# Patient Record
Sex: Female | Born: 1955 | Race: White | Hispanic: No | Marital: Married | Smoking: Never smoker
Health system: Western US, Academic
[De-identification: ages and names within clinical notes are randomized; demographics above are authoritative.]

## PROBLEM LIST (undated history)

## (undated) DIAGNOSIS — J329 Chronic sinusitis, unspecified: Secondary | ICD-10-CM

## (undated) DIAGNOSIS — F419 Anxiety disorder, unspecified: Principal | ICD-10-CM

## (undated) DIAGNOSIS — R0989 Other specified symptoms and signs involving the circulatory and respiratory systems: Secondary | ICD-10-CM

## (undated) DIAGNOSIS — I10 Essential (primary) hypertension: Secondary | ICD-10-CM

## (undated) DIAGNOSIS — J309 Allergic rhinitis, unspecified: Secondary | ICD-10-CM

## (undated) DIAGNOSIS — F329 Major depressive disorder, single episode, unspecified: Secondary | ICD-10-CM

## (undated) HISTORY — PX: TONSILLECTOMY: SUR1361

## (undated) HISTORY — PX: SINUSOTOMY: SHX291

## (undated) HISTORY — PX: JOINT REPLACEMENT: SHX530

## (undated) HISTORY — DX: Major depressive disorder, single episode, unspecified: F32.9

## (undated) HISTORY — DX: Anxiety disorder, unspecified: F41.9

## (undated) HISTORY — PX: ANTERIOR CRUCIATE LIGAMENT REPAIR: SHX115

## (undated) HISTORY — PX: TONSILLECTOMY AND ADENOIDECTOMY: SHX2683

## (undated) HISTORY — DX: Chronic sinusitis, unspecified: J32.9

## (undated) HISTORY — DX: Other specified symptoms and signs involving the circulatory and respiratory systems: R09.89

## (undated) HISTORY — PX: CARPAL TUNNEL RELEASE: SHX101

## (undated) HISTORY — DX: Essential (primary) hypertension: I10

## (undated) HISTORY — PX: ADENOIDECTOMY: SHX1128B

## (undated) MED ORDER — ALPRAZOLAM 0.25 MG OR TABS
0.25 mg | ORAL_TABLET | Freq: Every evening | ORAL | 0 refills | Status: AC | PRN
Start: 2018-07-30 — End: ?

## (undated) MED ORDER — LISINOPRIL 5 MG OR TABS
ORAL_TABLET | ORAL | 2 refills | Status: AC
Start: 2023-01-07 — End: ?

---

## 2014-10-07 DIAGNOSIS — M778 Other enthesopathies, not elsewhere classified: Secondary | ICD-10-CM

## 2015-04-10 DIAGNOSIS — F4320 Adjustment disorder, unspecified: Secondary | ICD-10-CM

## 2016-09-27 ENCOUNTER — Encounter (INDEPENDENT_AMBULATORY_CARE_PROVIDER_SITE_OTHER): Payer: Self-pay | Admitting: Family Medicine

## 2016-10-21 ENCOUNTER — Encounter (INDEPENDENT_AMBULATORY_CARE_PROVIDER_SITE_OTHER): Payer: Self-pay | Admitting: Physician Assistant

## 2016-10-21 ENCOUNTER — Ambulatory Visit (INDEPENDENT_AMBULATORY_CARE_PROVIDER_SITE_OTHER): Admitting: Physician Assistant

## 2016-10-21 VITALS — BP 120/78 | HR 78 | Temp 98.7°F | Resp 16 | Ht 63.5 in | Wt 162.0 lb

## 2016-10-21 DIAGNOSIS — Z1231 Encounter for screening mammogram for malignant neoplasm of breast: Secondary | ICD-10-CM

## 2016-10-21 DIAGNOSIS — F419 Anxiety disorder, unspecified: Principal | ICD-10-CM

## 2016-10-21 DIAGNOSIS — Z1239 Encounter for other screening for malignant neoplasm of breast: Secondary | ICD-10-CM

## 2016-10-21 DIAGNOSIS — R2242 Localized swelling, mass and lump, left lower limb: Secondary | ICD-10-CM

## 2016-10-21 DIAGNOSIS — B029 Zoster without complications: Secondary | ICD-10-CM

## 2016-10-21 MED ORDER — ALPRAZOLAM 0.25 MG OR TABS
0.2500 mg | ORAL_TABLET | Freq: Every evening | ORAL | 0 refills | Status: DC | PRN
Start: 2016-10-21 — End: 2019-01-25

## 2016-10-21 MED ORDER — VALACYCLOVIR HCL 1000 MG OR TABS
1000.0000 mg | ORAL_TABLET | Freq: Three times a day (TID) | ORAL | 0 refills | Status: DC
Start: 2016-10-21 — End: 2016-12-20

## 2016-10-21 MED ORDER — ALPRAZOLAM 0.25 MG OR TABS
0.25 mg | ORAL_TABLET | Freq: Every evening | ORAL | Status: DC | PRN
Start: ? — End: 2016-10-21

## 2016-10-21 MED ORDER — FLUTICASONE PROPIONATE 50 MCG/ACT NA SUSP
1.00 | Freq: Every day | NASAL | Status: DC
Start: ? — End: 2017-08-30

## 2016-10-24 ENCOUNTER — Telehealth (INDEPENDENT_AMBULATORY_CARE_PROVIDER_SITE_OTHER): Payer: Self-pay | Admitting: Physician Assistant

## 2016-10-24 NOTE — Telephone Encounter (Signed)
Podiatrist referral submitted to [facility].

## 2016-10-25 ENCOUNTER — Encounter (INDEPENDENT_AMBULATORY_CARE_PROVIDER_SITE_OTHER): Payer: Self-pay | Admitting: Physician Assistant

## 2016-11-24 ENCOUNTER — Encounter (INDEPENDENT_AMBULATORY_CARE_PROVIDER_SITE_OTHER): Payer: Self-pay | Admitting: Family Medicine

## 2016-11-24 HISTORY — PX: COLONOSCOPY: SHX174

## 2016-12-12 ENCOUNTER — Encounter (INDEPENDENT_AMBULATORY_CARE_PROVIDER_SITE_OTHER): Payer: Self-pay | Admitting: Family Medicine

## 2016-12-19 NOTE — Telephone Encounter (Signed)
Pt called back to check if we received her pathology report, pt was notified unfortunately not yet but I'll call [facility] to retreive the pathology report.    Contacted [facility] no answer left a detail message on the nurse dept line requesting for pathology report to be faxed to [phone].

## 2016-12-20 ENCOUNTER — Encounter (INDEPENDENT_AMBULATORY_CARE_PROVIDER_SITE_OTHER): Payer: Self-pay | Admitting: Physician Assistant

## 2016-12-20 ENCOUNTER — Ambulatory Visit (INDEPENDENT_AMBULATORY_CARE_PROVIDER_SITE_OTHER): Admitting: Physician Assistant

## 2016-12-20 VITALS — BP 142/98 | HR 72 | Temp 98.9°F | Resp 16 | Ht 63.5 in | Wt 161.8 lb

## 2016-12-20 DIAGNOSIS — J3489 Other specified disorders of nose and nasal sinuses: Principal | ICD-10-CM

## 2016-12-20 MED ORDER — FEXOFENADINE HCL 180 MG OR TABS
180.0000 mg | ORAL_TABLET | Freq: Every day | ORAL | 2 refills | Status: DC
Start: 2016-12-20 — End: 2017-01-13

## 2016-12-20 MED ORDER — METHYLPREDNISOLONE 4 MG OR KIT
ORAL_TABLET | ORAL | 0 refills | Status: DC
Start: 2016-12-20 — End: 2017-01-13

## 2017-01-03 ENCOUNTER — Encounter (INDEPENDENT_AMBULATORY_CARE_PROVIDER_SITE_OTHER): Payer: Self-pay

## 2017-01-03 ENCOUNTER — Encounter (INDEPENDENT_AMBULATORY_CARE_PROVIDER_SITE_OTHER): Payer: Self-pay | Admitting: Physician Assistant

## 2017-01-03 NOTE — Telephone Encounter (Signed)
From: Debbie Hudson  To: Debbie Hudson, [name]  Sent: 01/03/2017 2:27 PM PST  Subject: 20-Other    Hi,  I wanted to let you know that I am still having some sinus pressure and a bit of upper respiratory issues since my last visit and I did finish the steroid pack. I had maybe 2 days after it that I thought I was getting better. Sunday I woke up with a little bit of laryngitis and today the sinus pressure in my heBad is bothersome and I am extremely fatigued! You said to just call and maybe you would order a antibiotic or ? I tried calling the office this afternoon with no answer , so I am sending this message.  Debbie, Hudson  [phone]  [email or web address]

## 2017-01-13 ENCOUNTER — Telehealth (INDEPENDENT_AMBULATORY_CARE_PROVIDER_SITE_OTHER): Payer: Self-pay | Admitting: Physician Assistant

## 2017-01-13 ENCOUNTER — Other Ambulatory Visit (INDEPENDENT_AMBULATORY_CARE_PROVIDER_SITE_OTHER): Payer: Self-pay | Admitting: Physician Assistant

## 2017-01-13 ENCOUNTER — Ambulatory Visit (INDEPENDENT_AMBULATORY_CARE_PROVIDER_SITE_OTHER): Admitting: Physician Assistant

## 2017-01-13 ENCOUNTER — Encounter (INDEPENDENT_AMBULATORY_CARE_PROVIDER_SITE_OTHER): Payer: Self-pay | Admitting: Physician Assistant

## 2017-01-13 VITALS — BP 120/84 | HR 66 | Temp 98.3°F | Resp 14 | Ht 63.5 in | Wt 160.0 lb

## 2017-01-13 DIAGNOSIS — R0989 Other specified symptoms and signs involving the circulatory and respiratory systems: Secondary | ICD-10-CM

## 2017-01-13 DIAGNOSIS — J329 Chronic sinusitis, unspecified: Principal | ICD-10-CM

## 2017-01-13 MED ORDER — AZITHROMYCIN 250 MG OR TABS
250.0000 mg | ORAL_TABLET | ORAL | 0 refills | Status: DC
Start: 2017-01-13 — End: 2017-08-30

## 2017-01-13 MED ORDER — ALBUTEROL SULFATE 108 (90 BASE) MCG/ACT IN AERS
2.0000 | INHALATION_SPRAY | Freq: Four times a day (QID) | RESPIRATORY_TRACT | 0 refills | Status: DC | PRN
Start: 2017-01-13 — End: 2017-01-13

## 2017-01-13 MED ORDER — ALBUTEROL SULFATE 108 (90 BASE) MCG/ACT IN AERS
2.0000 | INHALATION_SPRAY | Freq: Four times a day (QID) | RESPIRATORY_TRACT | 0 refills | Status: DC | PRN
Start: 2017-01-13 — End: 2017-08-30

## 2017-01-13 NOTE — Telephone Encounter (Signed)
[facility] referral ENT submitted  Ref: [id number]

## 2017-01-13 NOTE — Assessment & Plan Note (Addendum)
Stable  Start albuterol as prescribed   Monitor symptoms  F/u if worsening

## 2017-01-13 NOTE — Progress Notes (Signed)
Encounter Date:  01/13/17  11:29 AM   PCP: Adrienne Mocha  MRN: [id number]        HPI:  Debbie Hudson is a 61 year old female presents c/o continued sinus issues since last visit x almost 1 month.    Pt admits to chronic sinusitis that is difficult to get rid of    Pt was on abx x 2 in the past and had to referred to ENT  Pt was placed on steroid pack, flonase, antihistamine with no relief. Pt states she might have felt relief for 2 days on the medrol pack, however symptoms returned.    Pt admits to being on allergy shot therapy and can be the likely reason this sinus issue is not going away.          PROBLEM  LIST:  Patient Active Problem List   Diagnosis   . Anxiety   . Herpes zoster without complication   . Mass of left foot   . Sinus pressure   . Recurrent sinusitis   . Chest congestion         PAST MEDICAL HISTORY:  Past Medical History:   Diagnosis Date   . Anxiety    . Major depressive disorder, single episode        PAST SURGICAL HISTORY:  Past Surgical History:   Procedure Laterality Date   . ANTERIOR CRUCIATE LIGAMENT REPAIR Left    . CARPAL TUNNEL RELEASE Bilateral    . CESAREAN SECTION, CLASSIC  1984, 1986   . TONSILLECTOMY AND ADENOIDECTOMY          FAMILY HISTORY:   Family History   Problem Relation Name Age of Onset   . Cancer Mother          Lymphoma   . Cancer Father          Colon cancer, tumer to neck carcoma         SOCIAL HISTORY:  Social History     Socioeconomic History   . Marital status: Married     Spouse name: Not on file   . Number of children: Not on file   . Years of education: Not on file   . Highest education level: Not on file   Social Needs   . Financial resource strain: Not on file   . Food insecurity - worry: Not on file   . Food insecurity - inability: Not on file   . Transportation needs - medical: Not on file   . Transportation needs - non-medical: Not on file   Occupational History   . Not on file   Tobacco Use   . Smoking status: Never Smoker   . Smokeless tobacco: Never Used      Substance and Sexual Activity   . Alcohol use: Yes     Comment: occ   . Drug use: No   . Sexual activity: Not on file   Other Topics Concern   . Not on file   Social History Narrative   . Not on file     Social History     Tobacco Use   Smoking Status Never Smoker   Smokeless Tobacco Never Used      Social History     Substance and Sexual Activity   Alcohol Use Yes    Comment: occ      Social History     Substance and Sexual Activity   Drug Use No  There is no immunization history on file for this patient.      CURRENT  MEDICATIONS:  Current Outpatient Medications on File Prior to Visit   Medication Sig Dispense Refill   . ALPRAZolam (XANAX) 0.25 MG tablet Take 1 tablet (0.25 mg) by mouth nightly as needed for Anxiety. 30 tablet 0   . [DISCONTINUED] fexofenadine (ALLEGRA) 180 MG tablet Take 1 tablet (180 mg) by mouth daily. 30 tablet 2   . fluticasone propionate (FLONASE) 50 MCG/ACT nasal spray Spray 1 bottle into each nostril daily.     . [DISCONTINUED] methylPREDNISolone (MEDROL DOSEPACK) 4 MG tablet Take as directed on package 1 Package 0     No current facility-administered medications on file prior to visit.      Outpatient Medications Marked as Taking for the 01/13/17 encounter (Office Visit) with Danton Sewer, PA   Medication Sig Dispense Refill   . ALPRAZolam (XANAX) 0.25 MG tablet Take 1 tablet (0.25 mg) by mouth nightly as needed for Anxiety. 30 tablet 0   . fluticasone propionate (FLONASE) 50 MCG/ACT nasal spray Spray 1 bottle into each nostril daily.          ALLERGIES:    Allergies   Allergen Reactions   . Penicillin G Rash   . Dilaudid  [Hydromorphone Hcl] Itching   . Hydrocodone Itching   . Venlafaxine Unspecified     Felt weird          REVIEW OF SYSTEMS:  Review of Systems  ROS negative except for HPI      PHYSICAL EXAM:   01/13/17  1123   BP: 120/84   Pulse: 66   Resp: 14   Temp: 98.3 F (36.8 C)     Body mass index is 27.9 kg/m.    Physical Exam   Constitutional: She is oriented to  person, place, and time. She appears well-developed and well-nourished.   HENT:   Head: Normocephalic and atraumatic.   Right Ear: Tympanic membrane is not injected, not scarred, not perforated and not erythematous. A middle ear effusion is present.   Left Ear: Tympanic membrane is not injected, not scarred, not perforated and not erythematous. A middle ear effusion is present.   Nose: Right sinus exhibits frontal sinus tenderness. Left sinus exhibits frontal sinus tenderness.   Cardiovascular: Normal rate.   Pulmonary/Chest: Effort normal and breath sounds normal. No respiratory distress. She has no wheezes.   Neurological: She is alert and oriented to person, place, and time.   Skin: Skin is warm and dry.   Psychiatric: She has a normal mood and affect. Her behavior is normal. Thought content normal.   Vitals reviewed.          ASSESSMENT & PLAN:    Debbie Hudson was seen today for sinus problem.    Diagnoses and all orders for this visit:    Recurrent sinusitis  Assessment & Plan:  Active  Start z-pack  Failed antihistamine  History of failing 2 courses of abx  Referral to ENT    Orders:  -     azithromycin (ZITHROMAX) 250 MG tablet; Take 1 tablet (250 mg) by mouth As Directed. Take 2 tablets by mouth on day 1, then take 1 tablet daily on days 2-5.  -     ENT Clinic    Chest congestion  Assessment & Plan:  Stable  Start albuterol as prescribed   Monitor symptoms  F/u if worsening      Orders:  -  albuterol 108 (90 Base) MCG/ACT inhaler; Inhale 2 puffs by mouth every 6 hours as needed for Wheezing.        ICD-10-CM ICD-9-CM    1. Recurrent sinusitis J32.9 473.9 azithromycin (ZITHROMAX) 250 MG tablet      ENT Clinic   2. Chest congestion R09.89 786.9 albuterol 108 (90 Base) MCG/ACT inhaler             No future appointments.        Electronically reviewed and signed by Danton Sewer, PA-C    [facility]  [email or web address]

## 2017-01-13 NOTE — Assessment & Plan Note (Signed)
Active  Start z-pack  Failed antihistamine  History of failing 2 courses of abx  Referral to ENT

## 2017-01-30 ENCOUNTER — Encounter (INDEPENDENT_AMBULATORY_CARE_PROVIDER_SITE_OTHER): Payer: Self-pay | Admitting: Family Medicine

## 2017-03-01 ENCOUNTER — Other Ambulatory Visit: Payer: Self-pay

## 2017-03-13 ENCOUNTER — Encounter (INDEPENDENT_AMBULATORY_CARE_PROVIDER_SITE_OTHER): Payer: Self-pay | Admitting: Family Medicine

## 2017-04-17 HISTORY — PX: SINUS SURGERY: SHX187

## 2017-07-25 ENCOUNTER — Encounter (INDEPENDENT_AMBULATORY_CARE_PROVIDER_SITE_OTHER): Payer: Self-pay

## 2017-08-01 ENCOUNTER — Ambulatory Visit (HOSPITAL_COMMUNITY)
Admission: EM | Admit: 2017-08-01 | Discharge: 2017-08-01 | Disposition: A | Discharge status: Home / Self Care | Attending: Family Medicine | Admitting: Family Medicine

## 2017-08-01 ENCOUNTER — Other Ambulatory Visit: Payer: Self-pay

## 2017-08-01 ENCOUNTER — Ambulatory Visit (INDEPENDENT_AMBULATORY_CARE_PROVIDER_SITE_OTHER)

## 2017-08-01 ENCOUNTER — Encounter (HOSPITAL_COMMUNITY): Payer: Self-pay | Admitting: Emergency Medicine

## 2017-08-01 DIAGNOSIS — S9031XA Contusion of right foot, initial encounter: Secondary | ICD-10-CM

## 2017-08-01 DIAGNOSIS — M79671 Pain in right foot: Secondary | ICD-10-CM | POA: Diagnosis not present
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
## 2017-08-27 ENCOUNTER — Other Ambulatory Visit: Payer: Self-pay

## 2017-08-30 ENCOUNTER — Ambulatory Visit (INDEPENDENT_AMBULATORY_CARE_PROVIDER_SITE_OTHER): Admitting: Family Medicine

## 2017-08-30 ENCOUNTER — Encounter (INDEPENDENT_AMBULATORY_CARE_PROVIDER_SITE_OTHER): Payer: Self-pay | Admitting: Family Medicine

## 2017-08-30 VITALS — BP 122/82 | HR 103 | Temp 99.2°F | Resp 14 | Ht 63.5 in | Wt 168.8 lb

## 2017-09-05 ENCOUNTER — Encounter (INDEPENDENT_AMBULATORY_CARE_PROVIDER_SITE_OTHER): Payer: Self-pay

## 2017-09-07 LAB — COMPREHENSIVE METABOLIC PANEL, BLOOD
ALT (SGPT): 20 U/L (ref 6–29)
AST (SGOT): 21 U/L (ref 10–35)
Albumin/Glob Ratio: 1.7 (calc) (ref 1.0–2.5)
Albumin: 4.5 g/dL (ref 3.6–5.1)
Alkaline Phos: 91 U/L (ref 33–130)
BUN: 15 mg/dL (ref 7–25)
Bilirubin, Total: 0.7 mg/dL (ref 0.2–1.2)
Calcium: 9.9 mg/dL (ref 8.6–10.4)
Carbon Dioxide: 27 mmol/L (ref 20–32)
Chloride: 104 mmol/L (ref 98–110)
Creatinine: 0.89 mg/dL (ref 0.50–0.99)
Globulin: 2.6 g/dL (calc) (ref 1.9–3.7)
Glucose: 89 mg/dL (ref 65–99)
Potassium: 3.9 mmol/L (ref 3.5–5.3)
Sodium: 141 mmol/L (ref 135–146)
Total Protein: 7.1 g/dL (ref 6.1–8.1)
eGFR African American: 81 mL/min/{1.73_m2} (ref >=60)
eGFR non-Afr.American: 69 mL/min/{1.73_m2} (ref >=60)

## 2017-09-07 LAB — LIPID PANEL W/REFLEX TO DIRECT LDL
Chol/HDLC Ratio: 2.9 (calc) (ref <5.0)
Cholesterol: 212 mg/dL — ABNORMAL HIGH (ref <200)
HDL Cholesterol: 74 mg/dL (ref >50)
LDL-Cholesterol: 115 mg/dL (calc) — ABNORMAL HIGH
Non-HDL Cholesterol: 138 mg/dL (calc) — ABNORMAL HIGH (ref <130)
Triglycerides: 118 mg/dL (ref <150)

## 2017-09-07 LAB — CBC WITH DIFF, BLOOD
Abs Basophils: 49 cells/uL (ref 0–200)
Abs Eosinophils: 130 cells/uL (ref 15–500)
Abs Lymphs: 2576 cells/uL (ref 850–3900)
Abs Monocytes: 340 cells/uL (ref 200–950)
Abs NRBC: 0 cells/uL
Abs Neutrophils: 2306 cells/uL (ref 1500–7800)
Basophils: 0.9 %
Eosinophils: 2.4 %
HCT: 44.1 % (ref 35.0–45.0)
HGB: 15.1 g/dL (ref 11.7–15.5)
Lymps: 47.7 %
MCH: 28.9 pg (ref 27.0–33.0)
MCHC: 34.2 g/dL (ref 32.0–36.0)
MCV: 84.5 fL (ref 80.0–100.0)
MPV: 10.7 fL (ref 7.5–12.5)
Monocytes: 6.3 %
PLT: 242 10*3/uL (ref 140–400)
RBC: 5.22 10*6/uL — ABNORMAL HIGH (ref 3.80–5.10)
RDW: 13.3 % (ref 11.0–15.0)
SEGS: 42.7 %
WBC: 5.4 10*3/uL (ref 3.8–10.8)

## 2017-09-07 LAB — FOOD SPECIFIC IGG ALLERGY (ADULT) PANEL - QUEST HISTORICAL
Cacao (Chocolate) (F93) IgG: 2.3 ug/mL — ABNORMAL HIGH (ref <2.0)
Casein (F78) IgG: 3.4 ug/mL — ABNORMAL HIGH (ref <2.0)
Codfish (F3) IgG: <2 ug/mL (ref <2.0)
Coffee (F221) IgG: 3 ug/mL — ABNORMAL HIGH (ref <2.0)
Egg White (F1) IgG: 3.2 ug/mL — ABNORMAL HIGH (ref <2.0)
Maize/Corn (F8) IgG: 2.2 ug/mL — ABNORMAL HIGH (ref <2.0)
Peanut (F13) IgG: <2 ug/mL (ref <2.0)
Soybean (F14) IgG: <2 ug/mL (ref <2.0)
Tomato (F25) IgG: 2.5 ug/mL — ABNORMAL HIGH (ref <2.0)
Wheat (F4) IgG: 5.1 ug/mL — ABNORMAL HIGH (ref <2.0)
Yeast (Bakers/Brewers) (F45) IgG: 3.5 ug/mL — ABNORMAL HIGH (ref <2.0)

## 2017-09-07 LAB — TSH, BLOOD: TSH: 1.87 mIU/L (ref 0.40–4.50)

## 2017-09-07 LAB — FREE THYROXINE, BLOOD: Free T4: 1.2 ng/dL (ref 0.8–1.8)

## 2017-09-07 LAB — SED RATE, BLOOD: Sed Rate: 2 mm/h (ref <=30)

## 2017-09-07 LAB — VITAMIN D, 25-OH TOTAL: Vitamin D, 25-OH, Total: 45 ng/mL (ref 30–100)

## 2018-02-13 ENCOUNTER — Encounter (INDEPENDENT_AMBULATORY_CARE_PROVIDER_SITE_OTHER): Payer: Self-pay | Admitting: Family Medicine

## 2018-02-14 ENCOUNTER — Encounter (INDEPENDENT_AMBULATORY_CARE_PROVIDER_SITE_OTHER): Payer: Self-pay | Admitting: Family Medicine

## 2018-04-17 ENCOUNTER — Encounter (INDEPENDENT_AMBULATORY_CARE_PROVIDER_SITE_OTHER): Payer: Self-pay | Admitting: Medical

## 2018-04-17 ENCOUNTER — Ambulatory Visit (INDEPENDENT_AMBULATORY_CARE_PROVIDER_SITE_OTHER): Admitting: Medical

## 2018-04-17 MED ORDER — CEPHALEXIN 500 MG OR CAPS
500.0000 mg | ORAL_CAPSULE | Freq: Two times a day (BID) | ORAL | 0 refills | Status: DC
Start: 2018-04-17 — End: 2019-01-25

## 2018-04-17 MED ORDER — FLUTICASONE PROPIONATE 50 MCG/ACT NA SUSP: 1.00 | Freq: Every day | NASAL | Status: AC

## 2018-07-30 ENCOUNTER — Other Ambulatory Visit (INDEPENDENT_AMBULATORY_CARE_PROVIDER_SITE_OTHER): Payer: Self-pay | Admitting: Physician Assistant

## 2018-11-18 ENCOUNTER — Encounter (INDEPENDENT_AMBULATORY_CARE_PROVIDER_SITE_OTHER): Payer: Self-pay | Admitting: Family Medicine

## 2018-11-29 ENCOUNTER — Encounter (INDEPENDENT_AMBULATORY_CARE_PROVIDER_SITE_OTHER): Payer: Self-pay

## 2018-11-30 ENCOUNTER — Encounter (INDEPENDENT_AMBULATORY_CARE_PROVIDER_SITE_OTHER): Payer: Self-pay

## 2018-12-12 ENCOUNTER — Other Ambulatory Visit: Payer: Self-pay

## 2018-12-12 ENCOUNTER — Telehealth (INDEPENDENT_AMBULATORY_CARE_PROVIDER_SITE_OTHER): Admitting: Physician Assistant

## 2018-12-12 ENCOUNTER — Encounter (INDEPENDENT_AMBULATORY_CARE_PROVIDER_SITE_OTHER): Payer: Self-pay

## 2018-12-12 VITALS — BP 145/93 | HR 100 | Temp 99.3°F | Ht 63.5 in | Wt 170.0 lb

## 2018-12-12 MED ORDER — ALBUTEROL SULFATE 108 (90 BASE) MCG/ACT IN AERS
2.0000 | INHALATION_SPRAY | Freq: Four times a day (QID) | RESPIRATORY_TRACT | 0 refills | Status: DC | PRN
Start: 2018-12-12 — End: 2019-03-27

## 2018-12-12 NOTE — Progress Notes (Signed)
Encounter Date:  12/12/18  9:39 AM   PCP: Debbie Hudson   MRN: [id number]  DOB: November 04, 1955    [facility] VIDEO CALL SERVICES ENCOUNTER  Pandemic Response Ambulatory Protocol    Evaluator(s):   Debbie Hudson is a 63 year old female  who was evaluated by: Physician Assistant (via telemedicine)    Statement:    A Relevant History (including allergies, medications, past medical history, relevant review of systems) and relevant exam as performed by the named provider, are as transcribed in this and/or the accompanying note. Please also see the patient questionnaire for details.    Patient Verification & Video medicine Consent:      -I have verified that the patient's identification to be correct via verbal confirmation of birth date and address & valid: Yes    -The patient, and / or surrogate, has been made aware that patient is to be evaluated today using a home video medicine visit technique (audio, video & digital data transmission): Yes    - The patient, and / or surrogate, has been made aware that patient has the right to refuse this type of evaluation at any time during the assessment period, and has been made aware of any alternatives to this type of evaluation: Yes    -The patient, and / or surrogate,  has been made aware that patient may need further evaluations in the future: Yes    -The patient, and / or surrogate, has signed a valid Informed Consent document (detailing risks, benefits, alternatives & costs), or is exempt from these requirements by law, which I verify is currently present in the [place] MEDICAL RECORD NUMBERYes    Patient is unable to be seen by a specialist in this clinic because:      Pandemic response ambulatory protocol.  Due to COVID-19 pandemic and a federally declared state of public health emergency, this service is being conducted via video call. Patient is at risk.     HPI:  Debbie Hudson is a 63 year old female presents at Home for the following:   Chief Complaint   Patient  presents with   . Other     63 y/o persents with subjective fever, muscle aches,runny nose,sore throat, cough,shortness of breath, headache     Viral URI like symptoms on 12/07/2018  Initial sx of sore throat that progressed into worsening sore throat, rhinorrhea, body aches, headaches and sinus pressure and tenderness. Following Monday 12/10/2018 some mild improvement but still with rhinorrhea.  Sx worsened on Tuesday night and woke up with wheezing, shortness of breath and headaches.  Lives husband and daughter who are not having any symptoms at this time.  Does admit granddaughter came to visit last week who had sx of URI.  Granddaughter is in daycare setting.      Exposure date: No known exposure at this time  Onset of symptoms: 12/07/2018    + Covid symptoms: Subjective fever, malaise, myalgia, cough, shortness of breath with wheezing, sore throat, runny nose, headaches     With seasonal allergies and nasal polyp removal 1 year ago.  Takes allergy and Flonase daily.  No hx of asthma or COPD.  No hx of smoking.   Did require inhalers in the past.      PROBLEM  LIST:  Patient Active Problem List   Diagnosis   . Anxiety   . Herpes zoster without complication   . Mass of left foot   . Sinus pressure   .  H/O colonoscopy   . Family history of malignant neoplasm of colon   . Tendinitis of wrist   . Osteoarthrosis   . Carpal tunnel syndrome   . Allergic rhinitis   . Anaclitic depression   . Fatigue, unspecified type   . Food intolerance   . Episode of moderate major depression (CMS-HCC)   . Weight gain   . UTI symptoms   . Encounter by telehealth for suspected COVID-19         PAST MEDICAL HISTORY:  Past Medical History:   Diagnosis Date   . Anxiety    . Chest congestion 01/13/2017   . Major depressive disorder, single episode    . Recurrent sinusitis 01/13/2017       PAST SURGICAL HISTORY:  Past Surgical History:   Procedure Laterality Date   . SINUS SURGERY  04/17/2017   . ANTERIOR CRUCIATE LIGAMENT REPAIR Left    .  CARPAL TUNNEL RELEASE Bilateral    . CESAREAN SECTION, CLASSIC  1984, 1986   . TONSILLECTOMY AND ADENOIDECTOMY          FAMILY HISTORY:   Family History   Problem Relation Name Age of Onset   . Cancer Mother          Lymphoma   . Cancer Father          Colon cancer, tumer to neck carcoma         SOCIAL HISTORY:  Social History     Socioeconomic History   . Marital status: Married     Spouse name: Not on file   . Number of children: Not on file   . Years of education: Not on file   . Highest education level: Not on file   Occupational History   . Not on file   Social Needs   . Financial resource strain: Not on file   . Food insecurity     Worry: Not on file     Inability: Not on file   . Transportation needs     Medical: Not on file     Non-medical: Not on file   Tobacco Use   . Smoking status: Never Smoker   . Smokeless tobacco: Never Used   Substance and Sexual Activity   . Alcohol use: Yes     Comment: occ   . Drug use: No   . Sexual activity: Not on file   Lifestyle   . Physical activity     Days per week: Not on file     Minutes per session: Not on file   . Stress: Not on file   Relationships   . Social Wellsite geologist on phone: Not on file     Gets together: Not on file     Attends religious service: Not on file     Active member of club or organization: Not on file     Attends meetings of clubs or organizations: Not on file     Relationship status: Not on file   . Intimate partner violence     Fear of current or ex partner: Not on file     Emotionally abused: Not on file     Physically abused: Not on file     Forced sexual activity: Not on file   Other Topics Concern   . Not on file   Social History Narrative   . Not on file     Social History  Tobacco Use   Smoking Status Never Smoker   Smokeless Tobacco Never Used      Social History     Substance and Sexual Activity   Alcohol Use Yes    Comment: occ      Social History     Substance and Sexual Activity   Drug Use No        Immunization History      Administered Date(s) Administered   . (Shingles) Herpes Zoster Vaccine (ZOSTAVAX) 11/10/2011   . Influenza Vaccine (Unspecified) 10/31/1996, 11/30/2007, 11/06/2009, 09/29/2010, 11/19/2013   . Influenza Vaccine >=6 Months 10/23/2008, 12/19/2011, 12/10/2012, 10/10/2016   . Pneumococcal 23 Vaccine (PNEUMOVAX-23) 09/29/2010   . Tdap 08/31/2009       Health Maintenance   Health Maintenance   Topic Date Due   . Colorectal Cancer Screening  06/26/1955   . Cervical Cancer Screening  1955-08-29   . Hepatitis C Screening  02/26/2007   . Shingles Vaccine (2 of 3) 01/05/2012   . Breast Cancer Screen  10/28/2017   . Influenza (1) 08/18/2018   . Tetanus (2 - Td) 09/01/2019   . Polio Vaccine  Aged Out   . HPV Vaccine <= 26 Yrs  Aged Out   . Meningococcal MCV4 Vaccine  Aged Out   . Pneumococcal 0-64 yrs  Aged Out        CURRENT  MEDICATIONS:  Current Outpatient Medications on File Prior to Visit   Medication Sig Dispense Refill   . ALPRAZolam (XANAX) 0.25 MG tablet Take 1 tablet (0.25 mg) by mouth nightly as needed for Anxiety. 30 tablet 0   . cephALEXin (KEFLEX) 500 MG capsule Take 1 capsule (500 mg) by mouth 2 times daily. 10 capsule 0   . fluticasone propionate (FLONASE) 50 MCG/ACT nasal spray Spray 1 spray into each nostril daily.       No current facility-administered medications on file prior to visit.      Outpatient Medications Marked as Taking for the 12/12/18 encounter [facility]) with Cherrie Gauze, PA   Medication Sig Dispense Refill   . ALPRAZolam (XANAX) 0.25 MG tablet Take 1 tablet (0.25 mg) by mouth nightly as needed for Anxiety. 30 tablet 0   . fluticasone propionate (FLONASE) 50 MCG/ACT nasal spray Spray 1 spray into each nostril daily.          ALLERGIES:    Allergies   Allergen Reactions   . Genecof-Xp Hives   . Penicillin G Rash   . Penicillins Rash   . Dilaudid  [Hydromorphone Hcl] Itching   . Hydrocodone Itching   . Venlafaxine Unspecified     Felt weird          REVIEW OF SYSTEMS:  Review of  Systems   Constitutional: Positive for fatigue. Negative for chills and fever.   HENT: Positive for rhinorrhea and sore throat. Negative for ear pain, hearing loss, postnasal drip, sinus pain and sneezing.    Eyes: Negative for pain.   Respiratory: Positive for cough, shortness of breath and wheezing. Negative for apnea.    Cardiovascular: Negative for chest pain and palpitations.   Gastrointestinal: Negative for abdominal pain, blood in stool, constipation, diarrhea, nausea and vomiting.   Musculoskeletal: Positive for myalgias. Negative for arthralgias, back pain and joint swelling.   Skin: Negative for rash.   Neurological: Positive for headaches. Negative for dizziness, tremors, seizures, syncope and weakness.       PHYSICAL EXAM:     12/12/18  1610  BP: (!) 145/93   Pulse: 100   Temp: 99.3 F (37.4 C)     Body mass index is 29.64 kg/m.    Ht Readings from Last 1 Encounters:   12/12/18 5' 3.5" (1.613 m)     Wt Readings from Last 1 Encounters:   12/12/18 77.1 kg (170 lb)       General Impression: Looks healthy, alert, no distress, pleasant affect, cooperative, communicative. Speech within normal limits. No signs of neurological decifits seen.      ASSESSMENT & PLAN:    Adaora was seen today for other.    Diagnoses and all orders for this visit:    Encounter by telehealth for suspected COVID-19  Assessment & Plan:  Exposure date: No known exposure at this time  Onset of symptoms: 12/07/2018    + Covid symptoms: Subjective fever, malaise, myalgia, cough, shortness of breath with wheezing, sore throat, runny nose, headaches     Self isolation for 14 days starting on first day without symptoms, attempt contact tracing if positive, social distance and wear mask if requiring to go outside for essential activities. Drink plenty of fluids and rest. Clean all common contact surface areas. Wear mask in the house in common areas. Attempt to sleep, eat and use a space in separate areas from family. Take Vitamin C 1000mg ,  Vitamin D3 5000 international units and Zinc 50mg  daily. APAP PRN for body aches and fever reduction. If fever uncontrolled with APAP, take NSAID 200-600mg  TID in addition to APAP.     Due to holidays no testing available with our clinic until Monday.    Recommend monitoring sx and consider testing on Monday for contact tracing if she ends up being positive.    Recommend home isolation 12/18/2018.      Orders:  -     albuterol 108 (90 Base) MCG/ACT inhaler; Inhale 2 puffs by mouth every 6 hours as needed for Wheezing.    Shortness of breath  -     albuterol 108 (90 Base) MCG/ACT inhaler; Inhale 2 puffs by mouth every 6 hours as needed for Wheezing.        ICD-10-CM ICD-9-CM    1. Encounter by telehealth for suspected COVID-19  Z20.828 V01.79 albuterol 108 (90 Base) MCG/ACT inhaler   2. Shortness of breath  R06.02 786.05 albuterol 108 (90 Base) MCG/ACT inhaler       Patient instructions:  See EPIC instructions.  Reviewed verbally and/or AVS available via MYchart for patient.      Barriers to learning assessed: None.    Patient/family verbalizes understanding and is agreeable to above plan.    Patient was evaluated by Cherrie Gauze PA-C at [facility]    TIME SPENT ON MEDICAL DISCUSSION: 15  Start time: 0930  Stop time: 0945      [id number]      [facility]  [email or web address]

## 2018-12-12 NOTE — Assessment & Plan Note (Signed)
Exposure date: No known exposure at this time  Onset of symptoms: 12/07/2018    + Covid symptoms: Subjective fever, malaise, myalgia, cough, shortness of breath with wheezing, sore throat, runny nose, headaches     Self isolation for 14 days starting on first day without symptoms, attempt contact tracing if positive, social distance and wear mask if requiring to go outside for essential activities. Drink plenty of fluids and rest. Clean all common contact surface areas. Wear mask in the house in common areas. Attempt to sleep, eat and use a space in separate areas from family. Take Vitamin C 1000mg , Vitamin D3 5000 international units and Zinc 50mg  daily. APAP PRN for body aches and fever reduction. If fever uncontrolled with APAP, take NSAID 200-600mg  TID in addition to APAP.     Due to holidays no testing available with our clinic until Monday.    Recommend monitoring sx and consider testing on Monday for contact tracing if she ends up being positive.    Recommend home isolation 12/18/2018.

## 2018-12-15 ENCOUNTER — Other Ambulatory Visit: Payer: Self-pay

## 2018-12-17 ENCOUNTER — Other Ambulatory Visit
Admission: RE | Admit: 2018-12-17 | Discharge: 2018-12-17 | Disposition: A | Discharge status: Home / Self Care | Payer: TRICARE Prime—HMO | Source: Ambulatory Visit | Attending: Physician Assistant | Admitting: Physician Assistant

## 2018-12-17 ENCOUNTER — Other Ambulatory Visit (INDEPENDENT_AMBULATORY_CARE_PROVIDER_SITE_OTHER): Admitting: Physician Assistant

## 2018-12-17 DIAGNOSIS — Z20828 Contact with and (suspected) exposure to other viral communicable diseases: Secondary | ICD-10-CM | POA: Insufficient documentation

## 2018-12-17 LAB — COVID-19 CORONAVIRUS DETECTION ASSAY AT ~~LOC~~ LAB: COVID-19 Coronavirus Result: NOT DETECTED

## 2018-12-17 NOTE — Progress Notes (Signed)
Encounter Date:  12/17/18  3:07 PM   PCP: Adrienne Mocha  MRN: [id number]        HPI:  Debbie Hudson is a 63 year old female presenting for COVID-19 testing.     Patient presents for drive through [facility] testing. Patient previously evaluated by provider who recommended testing.       PROBLEM  LIST:  Patient Active Problem List   Diagnosis   . Anxiety   . Herpes zoster without complication   . Mass of left foot   . Sinus pressure   . H/O colonoscopy   . Family history of malignant neoplasm of colon   . Tendinitis of wrist   . Osteoarthrosis   . Carpal tunnel syndrome   . Allergic rhinitis   . Anaclitic depression   . Fatigue, unspecified type   . Food intolerance   . Episode of moderate major depression (CMS-HCC)   . Weight gain   . UTI symptoms   . Encounter by telehealth for suspected COVID-19         PAST MEDICAL HISTORY:  Past Medical History:   Diagnosis Date   . Anxiety    . Chest congestion 01/13/2017   . Major depressive disorder, single episode    . Recurrent sinusitis 01/13/2017       PAST SURGICAL HISTORY:  Past Surgical History:   Procedure Laterality Date   . SINUS SURGERY  04/17/2017   . ANTERIOR CRUCIATE LIGAMENT REPAIR Left    . CARPAL TUNNEL RELEASE Bilateral    . CESAREAN SECTION, CLASSIC  1984, 1986   . TONSILLECTOMY AND ADENOIDECTOMY          FAMILY HISTORY:   Family History   Problem Relation Name Age of Onset   . Cancer Mother          Lymphoma   . Cancer Father          Colon cancer, tumer to neck carcoma         SOCIAL HISTORY:  Social History     Socioeconomic History   . Marital status: Married     Spouse name: Not on file   . Number of children: Not on file   . Years of education: Not on file   . Highest education level: Not on file   Occupational History   . Not on file   Social Needs   . Financial resource strain: Not on file   . Food insecurity     Worry: Not on file     Inability: Not on file   . Transportation needs     Medical: Not on file     Non-medical: Not on file   Tobacco Use      . Smoking status: Never Smoker   . Smokeless tobacco: Never Used   Substance and Sexual Activity   . Alcohol use: Yes     Comment: occ   . Drug use: No   . Sexual activity: Not on file   Lifestyle   . Physical activity     Days per week: Not on file     Minutes per session: Not on file   . Stress: Not on file   Relationships   . Social Wellsite geologist on phone: Not on file     Gets together: Not on file     Attends religious service: Not on file     Active member of club or organization: Not on file  Attends meetings of clubs or organizations: Not on file     Relationship status: Not on file   . Intimate partner violence     Fear of current or ex partner: Not on file     Emotionally abused: Not on file     Physically abused: Not on file     Forced sexual activity: Not on file   Other Topics Concern   . Not on file   Social History Narrative   . Not on file     Social History     Tobacco Use   Smoking Status Never Smoker   Smokeless Tobacco Never Used      Social History     Substance and Sexual Activity   Alcohol Use Yes    Comment: occ      Social History     Substance and Sexual Activity   Drug Use No        Immunization History   Administered Date(s) Administered   . (Shingles) Herpes Zoster Vaccine (ZOSTAVAX) 11/10/2011   . Influenza Vaccine (Unspecified) 10/31/1996, 11/30/2007, 11/06/2009, 09/29/2010, 11/19/2013   . Influenza Vaccine >=6 Months 10/23/2008, 12/19/2011, 12/10/2012, 10/10/2016   . Pneumococcal 23 Vaccine (PNEUMOVAX-23) 09/29/2010   . Tdap 08/31/2009         CURRENT  MEDICATIONS:  Current Outpatient Medications on File Prior to Visit   Medication Sig Dispense Refill   . albuterol 108 (90 Base) MCG/ACT inhaler Inhale 2 puffs by mouth every 6 hours as needed for Wheezing. 1 Inhaler 0   . ALPRAZolam (XANAX) 0.25 MG tablet Take 1 tablet (0.25 mg) by mouth nightly as needed for Anxiety. 30 tablet 0   . cephALEXin (KEFLEX) 500 MG capsule Take 1 capsule (500 mg) by mouth 2 times daily. 10  capsule 0   . fluticasone propionate (FLONASE) 50 MCG/ACT nasal spray Spray 1 spray into each nostril daily.       No current facility-administered medications on file prior to visit.      No outpatient medications have been marked as taking for the 12/17/18 encounter (Technician Only ) with Saunders Glance, PA.        ALLERGIES:    Allergies   Allergen Reactions   . Genecof-Xp Hives   . Penicillin G Rash   . Penicillins Rash   . Dilaudid  [Hydromorphone Hcl] Itching   . Hydrocodone Itching   . Venlafaxine Unspecified     Felt weird          REVIEW OF SYSTEMS:  Review of Systems  Unable to perform ROS: Acuity of condition    PHYSICAL EXAM:  There were no vitals filed for this visit.  There is no height or weight on file to calculate BMI.    Physical Exam  Nursing note reviewed.   Constitutional:    General: Not in acute distress.    Apperance: Not toxic-apperaing.  Neurological:    Mental Status: Alert.  Psychiatric:    Mood and Affect: Mood normal.       ASSESSMENT & PLAN:  Diagnoses and all orders for this visit:    Exposure to SARS-associated coronavirus  -     COVID-19 Coronavirus Detection Assay at [employer]; Future      Test completed.   Patient tolerated well.   Aftercare instructions given.    ICD-10-CM ICD-9-CM    1. Exposure to SARS-associated coronavirus  Z20.828 V01.82 COVID-19 Coronavirus Detection Assay at [facility]  No future appointments.        Electronically reviewed and signed by Saunders Glance PA-C      [facility]  [email or web address]

## 2018-12-18 ENCOUNTER — Telehealth (INDEPENDENT_AMBULATORY_CARE_PROVIDER_SITE_OTHER): Payer: Self-pay | Admitting: Physician Assistant

## 2018-12-18 NOTE — Progress Notes (Signed)
COVID19 Result message: Jeren Dufrane ---> CC [facility] COVID-19 RESULTS POOL     Please notify patient that test for COVID-19 is NEGATIVE. As this test is not 100% accurate it is important to follow the directions below.    Patient instructions as per below:  [Test Indication: Symptoms A]  Continue home isolation until:   1. At least 10 days have passed since your symptoms first appeared    AND   2. You have had no fever for at least 24 hours (that is one full day of no fever without the use medicine that reduces fevers)   AND   3. Other symptoms have improved (for example, when your cough or shortness of breath have improved)      Please fax PUI form to health department.    Thank you,  Latasia Silberstein, PA-C

## 2018-12-18 NOTE — Telephone Encounter (Addendum)
Patient notified of results please submit PUI form to health department.       ----- Message from Saunders Glance, [name] sent at 12/18/2018 12:59 PM PST -----  COVID19 Result message: Saunders Glance ---> CC [facility] COVID-19 RESULTS POOL     Please notify patient that test for COVID-19 is NEGATIVE. As this test is not 100% accurate it is important to follow the directions below.    Patient instructions as per below:  [Test Indication: Symptoms A]  Continue home isolation until:   1. At least 10 days have passed since your symptoms first appeared    AND   2. You have had no fever for at least 24 hours (that is one full day of no fever without the use medicine that reduces fevers)   AND   3. Other symptoms have improved (for example, when your cough or shortness of breath have improved)      Please fax PUI form to health department.    Thank you,  Saunders Glance, PA-C

## 2018-12-19 ENCOUNTER — Encounter (INDEPENDENT_AMBULATORY_CARE_PROVIDER_SITE_OTHER): Payer: Self-pay

## 2018-12-21 ENCOUNTER — Encounter (INDEPENDENT_AMBULATORY_CARE_PROVIDER_SITE_OTHER): Payer: Self-pay | Admitting: Family Medicine

## 2019-01-04 NOTE — Telephone Encounter (Signed)
PUI entered in z-drive

## 2019-01-25 ENCOUNTER — Telehealth (INDEPENDENT_AMBULATORY_CARE_PROVIDER_SITE_OTHER): Admitting: Family Medicine

## 2019-01-25 ENCOUNTER — Encounter (INDEPENDENT_AMBULATORY_CARE_PROVIDER_SITE_OTHER): Payer: Self-pay | Admitting: Family Medicine

## 2019-01-25 ENCOUNTER — Other Ambulatory Visit: Payer: Self-pay

## 2019-01-25 VITALS — Temp 99.3°F | Ht 63.5 in | Wt 170.0 lb

## 2019-01-25 MED ORDER — ALBUTEROL SULFATE 108 (90 BASE) MCG/ACT IN AERS
2.0000 | INHALATION_SPRAY | Freq: Four times a day (QID) | RESPIRATORY_TRACT | 3 refills | Status: DC | PRN
Start: 2019-01-25 — End: 2019-03-27

## 2019-01-25 MED ORDER — ALPRAZOLAM 0.25 MG OR TABS
0.2500 mg | ORAL_TABLET | Freq: Every evening | ORAL | Status: DC | PRN
Start: ? — End: 2019-01-25

## 2019-01-25 MED ORDER — PREDNISONE 20 MG OR TABS
20.0000 mg | ORAL_TABLET | Freq: Every day | ORAL | 0 refills | Status: AC
Start: 2019-01-25 — End: 2019-01-30

## 2019-01-25 NOTE — Assessment & Plan Note (Signed)
New  Due to recent travel with interval URI symptoms onset will schedule for covid19 testing  Patient counseled she is considered positive until test returns negative  Advised self-isolation, social distancing, masking, and proper hand-washing etiquette  Continue to monitor  Call office for new or worsening fever, chills, cough, body aches, shortness of breath, difficulty breathing, abdominal pain, nausea, vomiting, loss of sense of taste or smell

## 2019-01-25 NOTE — Assessment & Plan Note (Signed)
New  Start short course oral prednisone as directed  Prescription(s) sent to pharmacy  Albuterol refill sent to pharmacy, use as directed prn for shortness of breath and wheezing  Advised over the counter throat lozenges, warm tea with honey and lemon for cough  Increase fluid intake and rest  Continue to monitor  Call office if symptoms do not improve or patient develops new or worsening fever, chills, shortness of breath, wheezing, difficulty breathing

## 2019-01-25 NOTE — Progress Notes (Signed)
Encounter Date:  01/25/19 11:43 AM    PCP: Adrienne Mocha   MRN: [id number]  DOB: [birth year]    [facility] TELEPHONE SERVICES ENCOUNTER  Pandemic Response Ambulatory Protocol    Evaluator(s):   [name] is a 64 year old female  who was evaluated by: Dr. Wayland Salinas    Statement:    A Relevant History (including allergies, medications, past medical history, relevant review of systems) and relevant exam as performed by the named provider, are as transcribed in this and/or the accompanying note. Please also see the patient questionnaire for details.    Patient Verification & Telemedicine Consent:      -I have verified that the patient's identification to be correct via verbal confirmation of birth date and address & valid: Yes    -The patient, and / or surrogate, has been made aware that patient is to be evaluated today using a home telemedicine visit technique (audio transmission): Yes    - The patient, and / or surrogate, has been made aware that patient has the right to refuse this type of evaluation at any time during the assessment period, and has been made aware of any alternatives to this type of evaluation: Yes    -The patient, and / or surrogate,  has been made aware that patient may need further evaluations in the future: Yes    -The patient, and / or surrogate, has signed a valid Informed Consent document (detailing risks, benefits, alternatives & costs), or is exempt from these requirements by law, which I verify is currently present in the [facility] MEDICAL RECORD NUMBERYes    Patient is unable to be seen by a specialist in this clinic because:      Pandemic response ambulatory protocol.  Due to COVID-19 pandemic and a federally declared state of public health emergency, this service is being conducted via telephone. Patient is at risk.     HPI:  Debbie Hudson is a 64 year old female presents at [facility] for the following:   Chief Complaint   Patient presents with   .  Asthma     traveled over the holiday. pet allergy caused wheezing.   Marland Kitchen Refill Request     inhaler or possibly change inhaler?   . Cough     acute cough. low grade fever. fatigue.     Cough and wheezing   Patient was experiencing cold symptoms 1 month ago, was prescribed inhaler for wheezing.  Patient has history of asthma, previously on maintenance inhaler and allergy shots, was able to taper off inhaler and stopped injections in 2013.   Patient drove cross country to [place] with daughter who had cats and dogs, states she was using albuterol inhaler frequently.  Patient flew home Sunday 01/20/19.  Patient states on Monday she woke with cough, fatigue. Patient is also experiencing wheezing and has used albuterol inhaler frequency.   Patient requests albuterol inhaler refill.   Patient is taking antihistamine PRN.     Patient requests alprazolam refill. Patient is using sparingly for anxiety.     PROBLEM  LIST:  Patient Active Problem List   Diagnosis   . Anxiety   . Herpes zoster without complication   . Mass of left foot   . Sinus pressure   . H/O colonoscopy   . Family history of malignant neoplasm of colon   . Tendinitis of wrist   . Osteoarthrosis   . Carpal tunnel syndrome   .  Allergic rhinitis   . Anaclitic depression   . Fatigue, unspecified type   . Food intolerance   . Episode of moderate major depression (CMS-HCC)   . Weight gain   . UTI symptoms   . Encounter by telehealth for suspected COVID-19   . Mild intermittent asthma with exacerbation   . Cough         PAST MEDICAL HISTORY:  Past Medical History:   Diagnosis Date   . Anxiety    . Chest congestion 01/13/2017   . Major depressive disorder, single episode    . Recurrent sinusitis 01/13/2017       PAST SURGICAL HISTORY:  Past Surgical History:   Procedure Laterality Date   . SINUS SURGERY  04/17/2017   . ANTERIOR CRUCIATE LIGAMENT REPAIR Left    . CARPAL TUNNEL RELEASE Bilateral    . CESAREAN SECTION, CLASSIC  1984, 1986   . TONSILLECTOMY AND  ADENOIDECTOMY          FAMILY HISTORY:   Family History   Problem Relation Name Age of Onset   . Cancer Mother          Lymphoma   . Cancer Father          Colon cancer, tumer to neck carcoma         SOCIAL HISTORY:  Social History     Socioeconomic History   . Marital status: Married     Spouse name: Not on file   . Number of children: Not on file   . Years of education: Not on file   . Highest education level: Not on file   Occupational History   . Not on file   Social Needs   . Financial resource strain: Not on file   . Food insecurity     Worry: Not on file     Inability: Not on file   . Transportation needs     Medical: Not on file     Non-medical: Not on file   Tobacco Use   . Smoking status: Never Smoker   . Smokeless tobacco: Never Used   Substance and Sexual Activity   . Alcohol use: Yes     Comment: occ   . Drug use: No   . Sexual activity: Not on file   Lifestyle   . Physical activity     Days per week: Not on file     Minutes per session: Not on file   . Stress: Not on file   Relationships   . Social Wellsite geologist on phone: Not on file     Gets together: Not on file     Attends religious service: Not on file     Active member of club or organization: Not on file     Attends meetings of clubs or organizations: Not on file     Relationship status: Not on file   . Intimate partner violence     Fear of current or ex partner: Not on file     Emotionally abused: Not on file     Physically abused: Not on file     Forced sexual activity: Not on file   Other Topics Concern   . Not on file   Social History Narrative   . Not on file     Social History     Tobacco Use   Smoking Status Never Smoker   Smokeless Tobacco Never Used  Social History     Substance and Sexual Activity   Alcohol Use Yes    Comment: occ      Social History     Substance and Sexual Activity   Drug Use No        Immunization History   Administered Date(s) Administered   . (Shingles) Herpes Zoster Vaccine (ZOSTAVAX) 11/10/2011   .  Influenza Vaccine (Unspecified) 10/31/1996, 11/30/2007, 11/06/2009, 09/29/2010, 11/19/2013   . Influenza Vaccine >=6 Months 10/23/2008, 12/19/2011, 12/10/2012, 10/10/2016   . Pneumococcal 23 Vaccine (PNEUMOVAX-23) 09/29/2010   . Tdap 08/31/2009         CURRENT  MEDICATIONS:  Current Outpatient Medications on File Prior to Visit   Medication Sig Dispense Refill   . albuterol 108 (90 Base) MCG/ACT inhaler Inhale 2 puffs by mouth every 6 hours as needed for Wheezing. 1 Inhaler 0   . ALPRAZolam (XANAX) 0.25 MG tablet Take 0.25 mg by mouth nightly as needed for Insomnia.     . [DISCONTINUED] ALPRAZolam (XANAX) 0.25 MG tablet Take 1 tablet (0.25 mg) by mouth nightly as needed for Anxiety. 30 tablet 0   . [DISCONTINUED] cephALEXin (KEFLEX) 500 MG capsule Take 1 capsule (500 mg) by mouth 2 times daily. 10 capsule 0   . fluticasone propionate (FLONASE) 50 MCG/ACT nasal spray Spray 1 spray into each nostril daily.       No current facility-administered medications on file prior to visit.      Outpatient Medications Marked as Taking for the 01/25/19 encounter (Non-Face-to-Face) with Kandis Cocking, MD   Medication Sig Dispense Refill   . albuterol 108 (90 Base) MCG/ACT inhaler Inhale 2 puffs by mouth every 6 hours as needed for Wheezing. 1 Inhaler 0   . ALPRAZolam (XANAX) 0.25 MG tablet Take 0.25 mg by mouth nightly as needed for Insomnia.     . [DISCONTINUED] ALPRAZolam (XANAX) 0.25 MG tablet Take 1 tablet (0.25 mg) by mouth nightly as needed for Anxiety. 30 tablet 0   . fluticasone propionate (FLONASE) 50 MCG/ACT nasal spray Spray 1 spray into each nostril daily.          ALLERGIES:    Allergies   Allergen Reactions   . Genecof-Xp Hives   . Penicillin G Rash   . Penicillins Rash   . Dilaudid  [Hydromorphone Hcl] Itching   . Hydrocodone Itching   . Venlafaxine Unspecified     Felt weird          REVIEW OF SYSTEMS:  Review of Systems   Constitutional: Negative for chills.   Respiratory: Negative for shortness of breath.       Cardiovascular: Negative for chest pain, palpitations and leg swelling.   Gastrointestinal: Negative for abdominal pain, diarrhea, nausea and vomiting.   Skin: Negative for rash and wound.     Negative except as noted in the HPI    PHYSICAL EXAM:     01/25/19  1133   Temp: 99.3 F (37.4 C)     Body mass index is 29.64 kg/m.    Ht Readings from Last 1 Encounters:   01/25/19 5' 3.5" (1.613 m)     Wt Readings from Last 1 Encounters:   01/25/19 77.1 kg (170 lb)       General Impression: No physical exam done. Sounds healthy, alert, no distress, pleasant affect, cooperative, communicative    ASSESSMENT & PLAN:    Debbie Hudson was seen today for asthma, refill request and cough.    Diagnoses and all orders  for this visit:    Mild intermittent asthma with exacerbation  Assessment & Plan:  New  Start short course oral prednisone as directed  Prescription(s) sent to pharmacy  Albuterol refill sent to pharmacy, use as directed prn for shortness of breath and wheezing  Advised over the counter throat lozenges, warm tea with honey and lemon for cough  Increase fluid intake and rest  Continue to monitor  Call office if symptoms do not improve or patient develops new or worsening fever, chills, shortness of breath, wheezing, difficulty breathing    Orders:  -     predniSONE (DELTASONE) 20 MG tablet; Take 1 tablet (20 mg) by mouth daily for 5 days.  -     albuterol 108 (90 Base) MCG/ACT inhaler; Inhale 2 puffs by mouth every 6 hours as needed for Wheezing.    Cough  Assessment & Plan:  New  Due to recent travel with interval URI symptoms onset will schedule for covid19 testing  Patient counseled she is considered positive until test returns negative  Advised self-isolation, social distancing, masking, and proper hand-washing etiquette  Continue to monitor  Call office for new or worsening fever, chills, cough, body aches, shortness of breath, difficulty breathing, abdominal pain, nausea, vomiting, loss of sense of taste or  smell      Anxiety  Assessment & Plan:  Stable  Alprazolam refill sent to pharmacy, take as directed prn  Continue to monitor           ICD-10-CM ICD-9-CM    1. Mild intermittent asthma with exacerbation  J45.21 493.92 predniSONE (DELTASONE) 20 MG tablet      albuterol 108 (90 Base) MCG/ACT inhaler   2. Cough  R05 786.2    3. Anxiety  F41.9 300.00 ALPRAZolam (XANAX) 0.25 MG tablet       Patient instructions:  See EPIC instructions.  Reviewed verbally and/or AVS available via MYchart for patient.      Barriers to learning assessed: None.    Patient/family verbalizes understanding and is agreeable to above plan.    Patient was evaluated by Dr. Wayland Salinas at [facility].      TIME SPENT ON MEDICAL DISCUSSION INCLUDING BEFORE, DURING, AND AFTER PHONE CONVERSATION: 12 minutes  Start time: 11:43 AM  Stop time: 11:55 AM    By signing below, I acknowledge that I have reviewed the above note,  dictated by me and scribed by Zachery Dauer, for accuracy and edited  where necessary. The note accurately reflects the services provided at this  encounter. We retain the right to modify this information in the event of  errors. Occasional errors in punctuation, grammar and content may occur.   Encounter reviewed and electronically signed by Wayland Salinas, MD.     [facility]  [email or web address]    [phone]

## 2019-01-25 NOTE — Assessment & Plan Note (Signed)
Stable  Alprazolam refill sent to pharmacy, take as directed prn  Continue to monitor

## 2019-01-28 ENCOUNTER — Other Ambulatory Visit
Admission: RE | Admit: 2019-01-28 | Discharge: 2019-01-28 | Disposition: A | Discharge status: Home / Self Care | Payer: TRICARE Prime—HMO | Attending: Physician Assistant | Admitting: Physician Assistant

## 2019-01-28 ENCOUNTER — Other Ambulatory Visit (INDEPENDENT_AMBULATORY_CARE_PROVIDER_SITE_OTHER): Admitting: Physician Assistant

## 2019-01-28 DIAGNOSIS — Z20828 Contact with and (suspected) exposure to other viral communicable diseases: Secondary | ICD-10-CM

## 2019-01-28 DIAGNOSIS — U071 COVID-19: Secondary | ICD-10-CM | POA: Insufficient documentation

## 2019-01-28 NOTE — Progress Notes (Signed)
Encounter Date:  01/28/19  1:47 PM   PCP: Adrienne Mocha  MRN: [id number]        HPI:  Debbie Hudson is a 64 year old female presenting for COVID-19 testing.     Patient presents for drive through [facility] testing. Patient previously evaluated by provider who recommended testing.       PROBLEM  LIST:  Patient Active Problem List   Diagnosis   . Anxiety   . Herpes zoster without complication   . Mass of left foot   . Sinus pressure   . H/O colonoscopy   . Family history of malignant neoplasm of colon   . Tendinitis of wrist   . Osteoarthrosis   . Carpal tunnel syndrome   . Allergic rhinitis   . Anaclitic depression   . Fatigue, unspecified type   . Food intolerance   . Episode of moderate major depression (CMS-HCC)   . Weight gain   . UTI symptoms   . Encounter by telehealth for suspected COVID-19   . Mild intermittent asthma with exacerbation   . Cough         PAST MEDICAL HISTORY:  Past Medical History:   Diagnosis Date   . Anxiety    . Chest congestion 01/13/2017   . Major depressive disorder, single episode    . Recurrent sinusitis 01/13/2017       PAST SURGICAL HISTORY:  Past Surgical History:   Procedure Laterality Date   . SINUS SURGERY  04/17/2017   . ANTERIOR CRUCIATE LIGAMENT REPAIR Left    . CARPAL TUNNEL RELEASE Bilateral    . CESAREAN SECTION, CLASSIC  1984, 1986   . TONSILLECTOMY AND ADENOIDECTOMY          FAMILY HISTORY:   Family History   Problem Relation Name Age of Onset   . Cancer Mother          Lymphoma   . Cancer Father          Colon cancer, tumer to neck carcoma         SOCIAL HISTORY:  Social History     Socioeconomic History   . Marital status: Married     Spouse name: Not on file   . Number of children: Not on file   . Years of education: Not on file   . Highest education level: Not on file   Occupational History   . Not on file   Social Needs   . Financial resource strain: Not on file   . Food insecurity     Worry: Not on file     Inability: Not on file   . Transportation needs     Medical:  Not on file     Non-medical: Not on file   Tobacco Use   . Smoking status: Never Smoker   . Smokeless tobacco: Never Used   Substance and Sexual Activity   . Alcohol use: Yes     Comment: occ   . Drug use: No   . Sexual activity: Not on file   Lifestyle   . Physical activity     Days per week: Not on file     Minutes per session: Not on file   . Stress: Not on file   Relationships   . Social Wellsite geologist on phone: Not on file     Gets together: Not on file     Attends religious service: Not on file  Active member of club or organization: Not on file     Attends meetings of clubs or organizations: Not on file     Relationship status: Not on file   . Intimate partner violence     Fear of current or ex partner: Not on file     Emotionally abused: Not on file     Physically abused: Not on file     Forced sexual activity: Not on file   Other Topics Concern   . Not on file   Social History Narrative   . Not on file     Social History     Tobacco Use   Smoking Status Never Smoker   Smokeless Tobacco Never Used      Social History     Substance and Sexual Activity   Alcohol Use Yes    Comment: occ      Social History     Substance and Sexual Activity   Drug Use No        Immunization History   Administered Date(s) Administered   . (Shingles) Herpes Zoster Vaccine (ZOSTAVAX) 11/10/2011   . Influenza Vaccine (Unspecified) 10/31/1996, 11/30/2007, 11/06/2009, 09/29/2010, 11/19/2013   . Influenza Vaccine >=6 Months 10/23/2008, 12/19/2011, 12/10/2012, 10/10/2016   . Pneumococcal 23 Vaccine (PNEUMOVAX-23) 09/29/2010   . Tdap 08/31/2009         CURRENT  MEDICATIONS:  Current Outpatient Medications on File Prior to Visit   Medication Sig Dispense Refill   . albuterol 108 (90 Base) MCG/ACT inhaler Inhale 2 puffs by mouth every 6 hours as needed for Wheezing. 1 Inhaler 3   . albuterol 108 (90 Base) MCG/ACT inhaler Inhale 2 puffs by mouth every 6 hours as needed for Wheezing. 1 Inhaler 0   . ALPRAZolam (XANAX) 0.25 MG  tablet Take 0.25 mg by mouth nightly as needed for Insomnia.     . [DISCONTINUED] ALPRAZolam (XANAX) 0.25 MG tablet Take 1 tablet (0.25 mg) by mouth nightly as needed for Anxiety. 30 tablet 0   . [DISCONTINUED] cephALEXin (KEFLEX) 500 MG capsule Take 1 capsule (500 mg) by mouth 2 times daily. 10 capsule 0   . fluticasone propionate (FLONASE) 50 MCG/ACT nasal spray Spray 1 spray into each nostril daily.     . predniSONE (DELTASONE) 20 MG tablet Take 1 tablet (20 mg) by mouth daily for 5 days. 5 tablet 0     No current facility-administered medications on file prior to visit.      No outpatient medications have been marked as taking for the 01/28/19 encounter (Technician Only ) with Saunders Glance, PA.        ALLERGIES:    Allergies   Allergen Reactions   . Genecof-Xp Hives   . Penicillin G Rash   . Penicillins Rash   . Dilaudid  [Hydromorphone Hcl] Itching   . Hydrocodone Itching   . Venlafaxine Unspecified     Felt weird          REVIEW OF SYSTEMS:  Review of Systems  Unable to perform ROS: Acuity of condition    PHYSICAL EXAM:  There were no vitals filed for this visit.  There is no height or weight on file to calculate BMI.    Physical Exam  Nursing note reviewed.   Constitutional:    General: Not in acute distress.    Apperance: Not toxic-apperaing.  Neurological:    Mental Status: Alert.  Psychiatric:    Mood and Affect: Mood normal.  ASSESSMENT & PLAN:  Diagnoses and all orders for this visit:    Exposure to SARS-associated coronavirus  -     COVID-19 Coronavirus Detection Assay at [employer]; Future      Test completed.   Patient tolerated well.   Aftercare instructions given.    ICD-10-CM ICD-9-CM    1. Exposure to SARS-associated coronavirus  Z20.828 V01.82 COVID-19 Coronavirus Detection Assay at [facility]         No future appointments.        Electronically reviewed and signed by Saunders Glance PA-C      [facility]  [email or web address]

## 2019-01-29 ENCOUNTER — Telehealth (INDEPENDENT_AMBULATORY_CARE_PROVIDER_SITE_OTHER): Payer: Self-pay | Admitting: Physician Assistant

## 2019-01-29 LAB — COVID-19 CORONAVIRUS DETECTION ASSAY AT ~~LOC~~ LAB: COVID-19 Coronavirus Result: DETECTED — AB

## 2019-01-29 MED ORDER — ALPRAZOLAM 0.25 MG OR TABS
0.2500 mg | ORAL_TABLET | Freq: Every evening | ORAL | 0 refills | Status: DC | PRN
Start: 2019-01-29 — End: 2022-04-04

## 2019-01-29 NOTE — Telephone Encounter (Addendum)
PT NOTIFIED, VERBALIZED UNDERSTANDING. PLEASE SUBMIT CMR FORM.     ----- Message from Saunders Glance, PA sent at 01/29/2019 10:51 AM PST -----  COVID19 Result message: Saunders Glance ---> CC [facility] COVID-19 RESULTS POOL     Please notify patient of POSITIVE COVID19 result and review the following with patient    1. Review recommended management:  - Rest  - Stay well hydrated: Drink plenty of fluids, especially water and fluids with electrolytes.  - Tylenol as needed to reduce fever or pain/discomfort    2. Review home isolation instructions:  - Isolate at home: Do not leave your house except for medical care.   - If you live with others, isolate to one room, use a separate bathroom, avoid sharing household items, clean high touch surfaces daily.  - More information regarding home isolation can be found online at [email or web address] or your counties health dept website.     3. Review the 3 criteria for discharge from home isolation:  1. At least 10 days have passed since your symptoms first appeared    AND  2. You have had no fever for at least 24 hours (that is one full day of no fever without the use medicine that reduces fevers)  AND  3. Other symptoms have improved (for example, when your cough or shortness of breath have improved)    4. Review ER precautions with patient:  - If increasing SOB, or new chest pain develops call our office right away, or for severe symptoms, go to the ER. Please call the ER ahead of arrival to inform them you are COVID19 positive so they can be prepared.   - Also call our office if any other symptom becomes severe.    5. Offer patient rapid follow-up appointment if they would like to discuss further their COVID19 diagnosis with a provider.      Please fax result and completed CRM form to [phone].    Thank you,  Saunders Glance, PA-C

## 2019-01-29 NOTE — Progress Notes (Signed)
COVID19 Result message: Debbie Hudson ---> CC [facility] COVID-19 RESULTS POOL     Please notify patient of POSITIVE COVID19 result and review the following with patient    1. Review recommended management:  - Rest  - Stay well hydrated: Drink plenty of fluids, especially water and fluids with electrolytes.  - Tylenol as needed to reduce fever or pain/discomfort    2. Review home isolation instructions:  - Isolate at home: Do not leave your house except for medical care.   - If you live with others, isolate to one room, use a separate bathroom, avoid sharing household items, clean high touch surfaces daily.  - More information regarding home isolation can be found online at CDC.com or your counties health dept website.     3. Review the 3 criteria for discharge from home isolation:  1. At least 10 days have passed since your symptoms first appeared    AND  2. You have had no fever for at least 24 hours (that is one full day of no fever without the use medicine that reduces fevers)  AND  3. Other symptoms have improved (for example, when your cough or shortness of breath have improved)    4. Review ER precautions with patient:  - If increasing SOB, or new chest pain develops call our office right away, or for severe symptoms, go to the ER. Please call the ER ahead of arrival to inform them you are COVID19 positive so they can be prepared.   - Also call our office if any other symptom becomes severe.    5. Offer patient rapid follow-up appointment if they would like to discuss further their COVID19 diagnosis with a provider.      Please fax result and completed CRM form to [phone].    Thank you,  Eiko Mcgowen, PA-C

## 2019-02-01 ENCOUNTER — Encounter: Payer: Self-pay | Admitting: Hospital

## 2019-02-21 ENCOUNTER — Telehealth (INDEPENDENT_AMBULATORY_CARE_PROVIDER_SITE_OTHER): Admitting: Physician Assistant

## 2019-02-21 ENCOUNTER — Encounter (INDEPENDENT_AMBULATORY_CARE_PROVIDER_SITE_OTHER): Payer: Self-pay | Admitting: Physician Assistant

## 2019-02-21 VITALS — BP 137/83 | HR 78 | Temp 98.0°F | Ht 63.5 in | Wt 170.0 lb

## 2019-02-21 MED ORDER — AZITHROMYCIN 250 MG OR TABS
250.0000 mg | ORAL_TABLET | ORAL | 0 refills | Status: DC
Start: 2019-02-21 — End: 2019-03-27

## 2019-02-21 NOTE — Progress Notes (Signed)
Encounter Date:  02/21/19  12:47 PM   PCP: Adrienne Mocha  MRN: [id number]  DOB: 15-May-1955    [facility] TELEPHONE SERVICES ENCOUNTER  Pandemic Response Ambulatory Protocol    Evaluator(s):   Sumedha Matusik is a 64 year old female  who was evaluated by: Margaretha Seeds, PA-C    Statement:    A Relevant History (including allergies, medications, past medical history, relevant review of systems) and relevant exam as performed by the named provider, are as transcribed in this and/or the accompanying note. Please also see the patient questionnaire for details.    Patient Verification & Telemedicine Consent:      -I have verified that the patient's identification to be correct via verbal confirmation of birth date and address & valid: Yes    -The patient, and / or surrogate, has been made aware that patient is to be evaluated today using a home telemedicine visit technique (audio transmission): Yes    - The patient, and / or surrogate, has been made aware that patient has the right to refuse this type of evaluation at any time during the assessment period, and has been made aware of any alternatives to this type of evaluation: Yes    -The patient, and / or surrogate,  has been made aware that patient may need further evaluations in the future: Yes    -The patient, and / or surrogate, has signed a valid Informed Consent document (detailing risks, benefits, alternatives & costs), or is exempt from these requirements by law, which I verify is currently present in the [place] MEDICAL RECORD [id number]    Patient is unable to be seen by a specialist in this clinic because:      Pandemic response ambulatory protocol     HPI:  Debbie Hudson is a 64 year old female presents calling [facility] from home for the following:   Chief Complaint   Patient presents with   . COVID-19 Re-check / Follow Up     Pt tested positive for Covid on the 11th , pt has been c/o having issues w/ wheezing, congestion. Pt was given Pro air inhaler but  states she wasnt finding much relief from it. Pt states that she found a Ventolin inhaler which he helping much better. Pt states it's been a whole month since she started feeling sick and is not having any relief with the congestion in the head , ears, etc.      C/o continued pressure in sinus, nasal congestion, some wheezing X 3-4 weeks   Was positive for covid 4 weeks ago   Denies SOB, F/C, no discolored mucus or drainage   Pain over maxillary / frontal sinuses     PROBLEM  LIST:  Patient Active Problem List   Diagnosis   . Anxiety   . Herpes zoster without complication   . Mass of left foot   . Sinus pressure   . H/O colonoscopy   . Family history of malignant neoplasm of colon   . Tendinitis of wrist   . Osteoarthrosis   . Carpal tunnel syndrome   . Allergic rhinitis   . Anaclitic depression   . Fatigue, unspecified type   . Food intolerance   . Episode of moderate major depression (CMS-HCC)   . Weight gain   . UTI symptoms   . Encounter by telehealth for suspected COVID-19   . Mild intermittent asthma with exacerbation   . Cough  PAST MEDICAL HISTORY:  Past Medical History:   Diagnosis Date   . Anxiety    . Chest congestion 01/13/2017   . Major depressive disorder, single episode    . Recurrent sinusitis 01/13/2017       PAST SURGICAL HISTORY:  Past Surgical History:   Procedure Laterality Date   . SINUS SURGERY  04/17/2017   . ANTERIOR CRUCIATE LIGAMENT REPAIR Left    . CARPAL TUNNEL RELEASE Bilateral    . CESAREAN SECTION, CLASSIC  1984, 1986   . TONSILLECTOMY AND ADENOIDECTOMY          FAMILY HISTORY:   Family History   Problem Relation Name Age of Onset   . Cancer Mother          Lymphoma   . Cancer Father          Colon cancer, tumer to neck carcoma         SOCIAL HISTORY:  Social History     Socioeconomic History   . Marital status: Married     Spouse name: Not on file   . Number of children: Not on file   . Years of education: Not on file   . Highest education level: Not on file   Occupational  History   . Not on file   Social Needs   . Financial resource strain: Not on file   . Food insecurity     Worry: Not on file     Inability: Not on file   . Transportation needs     Medical: Not on file     Non-medical: Not on file   Tobacco Use   . Smoking status: Never Smoker   . Smokeless tobacco: Never Used   Substance and Sexual Activity   . Alcohol use: Yes     Comment: occ   . Drug use: No   . Sexual activity: Not on file   Lifestyle   . Physical activity     Days per week: Not on file     Minutes per session: Not on file   . Stress: Not on file   Relationships   . Social Wellsite geologist on phone: Not on file     Gets together: Not on file     Attends religious service: Not on file     Active member of club or organization: Not on file     Attends meetings of clubs or organizations: Not on file     Relationship status: Not on file   . Intimate partner violence     Fear of current or ex partner: Not on file     Emotionally abused: Not on file     Physically abused: Not on file     Forced sexual activity: Not on file   Other Topics Concern   . Not on file   Social History Narrative   . Not on file     Social History     Tobacco Use   Smoking Status Never Smoker   Smokeless Tobacco Never Used      Social History     Substance and Sexual Activity   Alcohol Use Yes    Comment: occ      Social History     Substance and Sexual Activity   Drug Use No        Immunization History   Administered Date(s) Administered   . (Shingles) Herpes Zoster Vaccine (ZOSTAVAX) 11/10/2011   .  Influenza Vaccine (Unspecified) 10/31/1996, 11/30/2007, 11/06/2009, 09/29/2010, 11/19/2013   . Influenza Vaccine >=6 Months 10/23/2008, 12/19/2011, 12/10/2012, 10/10/2016   . Pneumococcal 23 Vaccine (PNEUMOVAX-23) 09/29/2010   . Tdap 08/31/2009         CURRENT  MEDICATIONS:  Current Outpatient Medications on File Prior to Visit   Medication Sig Dispense Refill   . albuterol 108 (90 Base) MCG/ACT inhaler Inhale 2 puffs by mouth every 6 hours  as needed for Wheezing. 1 Inhaler 3   . albuterol 108 (90 Base) MCG/ACT inhaler Inhale 2 puffs by mouth every 6 hours as needed for Wheezing. 1 Inhaler 0   . ALPRAZolam (XANAX) 0.25 MG tablet Take 1 tablet (0.25 mg) by mouth nightly as needed for Anxiety. 5 tablet 0   . fluticasone propionate (FLONASE) 50 MCG/ACT nasal spray Spray 1 spray into each nostril daily.       No current facility-administered medications on file prior to visit.      Outpatient Medications Marked as Taking for the 02/21/19 encounter (Non-Face-to-Face) with Margaretha Seeds, [name]   Medication Sig Dispense Refill   . ALPRAZolam (XANAX) 0.25 MG tablet Take 1 tablet (0.25 mg) by mouth nightly as needed for Anxiety. 5 tablet 0   . fluticasone propionate (FLONASE) 50 MCG/ACT nasal spray Spray 1 spray into each nostril daily.          ALLERGIES:    Allergies   Allergen Reactions   . Genecof-Xp Hives   . Penicillin G Rash   . Penicillins Rash   . Dilaudid  [Hydromorphone Hcl] Itching   . Hydrocodone Itching   . Venlafaxine Unspecified     Felt weird          REVIEW OF SYSTEMS:  Review of Systems   All other systems reviewed and are negative.    Negative except as noted in HPI    PHYSICAL EXAM:  General Impression: healthy, alert, no distress, pleasant affect, cooperative, communicative     02/21/19  1221   BP: 137/83   Pulse: 78   Temp: 98 F (36.7 C)     Body mass index is 29.64 kg/m.    Ht Readings from Last 1 Encounters:   02/21/19 5' 3.5" (1.613 m)     Wt Readings from Last 1 Encounters:   02/21/19 77.1 kg (170 lb)           Physical Exam        ASSESSMENT & PLAN:    Assetou was seen today for covid-19 re-check / follow up.    Diagnoses and all orders for this visit:    Acute recurrent maxillary sinusitis  -     azithromycin (ZITHROMAX) 250 MG tablet; Take 1 tablet (250 mg) by mouth As Directed. Take 2 tablets by mouth on day 1, then take 1 tablet daily on days 2-5.        Stable   Start antibiotics    Start Flonase twice a day   Start over the  counter Claritin to help dry up secretions   Over the counter Mucinex DM for cough    Increase water intake and rest     Over the counter Ibuprofen or Tylenol for fever or body aches   If symptoms worsening or not improving return to clinic to ER   Follow up with primary care provider in 3-4 days        ICD-10-CM ICD-9-CM    1. Acute recurrent maxillary sinusitis  J01.01 461.0 azithromycin (ZITHROMAX) 250  MG tablet       Patient instructions:  See EPIC instructions.  Reviewed verbally and/or AVS available via MYchart for patient.      Barriers to learning assessed: None.    Patient/family verbalizes understanding and is agreeable to above plan.    Patient was evaluated by Margaretha Seeds PA-C at [facility]    TIME SPENT ON MEDICAL DISCUSSION INCLUDING BEFORE, DURING, AND AFTER PHONE CONVERSATION: 15 min    Start & Stop time: [phone]  [id number]      Electronically signed and reviewed by Margaretha Seeds, PA-C    [facility]  [email or web address]

## 2019-03-27 ENCOUNTER — Encounter (INDEPENDENT_AMBULATORY_CARE_PROVIDER_SITE_OTHER): Payer: Self-pay | Admitting: Family Medicine

## 2019-03-27 MED ORDER — ALBUTEROL SULFATE 108 (90 BASE) MCG/ACT IN AERS
2.0000 | INHALATION_SPRAY | Freq: Four times a day (QID) | RESPIRATORY_TRACT | 0 refills | Status: DC | PRN
Start: 2019-03-27 — End: 2019-09-10

## 2019-03-29 ENCOUNTER — Encounter (INDEPENDENT_AMBULATORY_CARE_PROVIDER_SITE_OTHER): Payer: Self-pay | Admitting: Hospital

## 2019-04-04 ENCOUNTER — Encounter (INDEPENDENT_AMBULATORY_CARE_PROVIDER_SITE_OTHER): Payer: Self-pay | Admitting: Hospital

## 2019-04-04 ENCOUNTER — Encounter (INDEPENDENT_AMBULATORY_CARE_PROVIDER_SITE_OTHER): Payer: Self-pay

## 2019-06-09 ENCOUNTER — Other Ambulatory Visit: Payer: Self-pay

## 2019-06-10 ENCOUNTER — Ambulatory Visit (INDEPENDENT_AMBULATORY_CARE_PROVIDER_SITE_OTHER): Admitting: Family Medicine

## 2019-06-10 VITALS — BP 150/86 | HR 64 | Temp 98.9°F | Resp 14 | Ht 63.5 in | Wt 170.4 lb

## 2019-06-10 MED ORDER — AZITHROMYCIN 250 MG OR TABS
ORAL_TABLET | ORAL | 0 refills | Status: AC
Start: 2019-06-10 — End: 2019-06-15

## 2019-06-11 LAB — COMPREHENSIVE METABOLIC PANEL, BLOOD
ALT (SGPT): 18 U/L (ref 6–29)
AST (SGOT): 18 U/L (ref 10–35)
Albumin/Glob Ratio: 1.8 (calc) (ref 1.0–2.5)
Albumin: 4.6 g/dL (ref 3.6–5.1)
Alkaline Phos: 82 U/L (ref 37–153)
BUN: 11 mg/dL (ref 7–25)
Bilirubin, Total: 0.7 mg/dL (ref 0.2–1.2)
Calcium: 9.9 mg/dL (ref 8.6–10.4)
Carbon Dioxide: 27 mmol/L (ref 20–32)
Chloride: 105 mmol/L (ref 98–110)
Creatinine: 0.82 mg/dL (ref 0.50–0.99)
Globulin: 2.6 g/dL (calc) (ref 1.9–3.7)
Glucose: 93 mg/dL (ref 65–99)
Potassium: 4 mmol/L (ref 3.5–5.3)
Sodium: 142 mmol/L (ref 135–146)
Total Protein: 7.2 g/dL (ref 6.1–8.1)
eGFR African American: 88 mL/min/{1.73_m2} (ref >=60)
eGFR non-Afr.American: 76 mL/min/{1.73_m2} (ref >=60)

## 2019-06-11 LAB — CBC WITH DIFF, BLOOD
Abs Basophils: 49 cells/uL (ref 0–200)
Abs Eosinophils: 59 cells/uL (ref 15–500)
Abs Lymphs: 1847 cells/uL (ref 850–3900)
Abs Monocytes: 346 cells/uL (ref 200–950)
Abs NRBC: 0 cells/uL
Abs Neutrophils: 3100 cells/uL (ref 1500–7800)
Basophils: 0.9 %
Eosinophils: 1.1 %
HCT: 44.5 % (ref 35.0–45.0)
HGB: 15 g/dL (ref 11.7–15.5)
Lymps: 34.2 %
MCH: 28.6 pg (ref 27.0–33.0)
MCHC: 33.7 g/dL (ref 32.0–36.0)
MCV: 84.8 fL (ref 80.0–100.0)
MPV: 11.1 fL (ref 7.5–12.5)
Monocytes: 6.4 %
PLT: 238 10*3/uL (ref 140–400)
RBC: 5.25 10*6/uL — ABNORMAL HIGH (ref 3.80–5.10)
RDW: 12.6 % (ref 11.0–15.0)
SEGS: 57.4 %
WBC: 5.4 10*3/uL (ref 3.8–10.8)

## 2019-06-11 LAB — LIPID PANEL W/REFLEX TO DIRECT LDL
Chol/HDLC Ratio: 3 (calc) (ref <5.0)
Cholesterol: 222 mg/dL — ABNORMAL HIGH (ref <200)
HDL Cholesterol: 73 mg/dL (ref >=50)
LDL-Cholesterol: 125 mg/dL (calc) — ABNORMAL HIGH
Non-HDL Cholesterol: 149 mg/dL (calc) — ABNORMAL HIGH (ref <130)
Triglycerides: 127 mg/dL (ref <150)

## 2019-06-11 LAB — HEPATITIS C AB, BLOOD
Hepatis C AB Signal/Cut Off: 0.02 (ref <1.00)
Hepatitis C Ab: NONREACTIVE

## 2019-06-11 LAB — TSH, BLOOD: TSH: 2.27 mIU/L (ref 0.40–4.50)

## 2019-06-20 ENCOUNTER — Telehealth (INDEPENDENT_AMBULATORY_CARE_PROVIDER_SITE_OTHER): Payer: Self-pay | Admitting: Family Medicine

## 2019-06-20 NOTE — Telephone Encounter (Signed)
 Patient called to f/u on her [facility] referral, requesting a call back with an update on the referral, please advise

## 2019-06-21 ENCOUNTER — Encounter (INDEPENDENT_AMBULATORY_CARE_PROVIDER_SITE_OTHER): Payer: Self-pay | Admitting: Family Medicine

## 2019-06-21 NOTE — Telephone Encounter (Signed)
 Referral submitted to [facility], left detailed message for patient.

## 2019-06-26 ENCOUNTER — Encounter (INDEPENDENT_AMBULATORY_CARE_PROVIDER_SITE_OTHER): Payer: Self-pay | Admitting: Family Medicine

## 2019-06-26 ENCOUNTER — Ambulatory Visit (INDEPENDENT_AMBULATORY_CARE_PROVIDER_SITE_OTHER): Admitting: Family Medicine

## 2019-06-26 VITALS — BP 142/98 | HR 84 | Temp 98.6°F | Resp 12 | Ht 63.5 in | Wt 173.0 lb

## 2019-06-26 NOTE — Assessment & Plan Note (Signed)
.   Stable  . Optometry/Ophthalmology appointment advised if needed  . Yearly dental appointments advised.   . Colon cancer screening (ie sigmoidoscopy, colonoscopy or occult blood) UTD  . Immunizations discussed  and given/ordered if due and desired  . Laboratory tests  ordered  if due  . Healthy diet and routine physical activity advised.

## 2019-06-26 NOTE — Progress Notes (Addendum)
($)   ECG      Date/Time: 06/26/2019 9:54 AM  Performed by: Clorinda Daniel, MD  Authorized by: Clorinda Daniel, MD   Interpreted by physician  Rhythm: sinus rhythm  Rate: normal  BPM: 61  Conduction: conduction normal  ST Segments: ST segments normal  T Waves: T waves normal  Other: no other findings  Clinical impression: normal ECG

## 2019-06-26 NOTE — Progress Notes (Addendum)
 [facility]  [place] - [place]-  [place] - [place] - [place]   [email or web address] and [email or web address]  Telephone: [phone]             Encounter Date:  06/26/19  9:40 AM   PCP: Clorinda Daniel  MRN: [id number]  DOB: October 02, 1955       HPI:  Debbie Hudson is a 64 year old female presents   Chief Complaint   Patient presents with   . Physical     Well Female Exam, c/o  palpitations off and on when resting for less than 30 minutes     Pt states she has been getting intermittent palpitations when she is at rest  Pt states they occur for less than 30 minutes at a time  Pt has a FitBit and her HR will sometimes be in the 90s  Pt's BP 142/98     Pt has a lesion on her left eyebrow and on the right side of her face near her forehead    Pt is doing Weight Watchers  Pt is trying to lose weight    Pt states she is getting trigger finger in her right hand    Pt does not want the shingles vaccine because she had a bad reaction to the previous one she had    Medical History: Recurrent sinusitis, mild asthma, osteoarthrosis, MDD, anxiety  FHx: Mother (Lymphoma), Father (Colon cancer)  SHx: Does not smoke. Drinks occasionally.  SurgHx: Tonsillectomy and adenoidectomy, sinus surgery, C-section (1984, 1986), carpal tunnel release (bilateral), left anterior cruciate ligament repair  Medications: albuterol  inhaler, alprazolam , Flonase   Vaccinations:             Shingles: 11/10/2011            Pneumovax 23: 09/29/2010            Tdap: 08/31/2009  Colonoscopy: 11/24/2016  Mammo: 06/24/2019  Pap: Due  Eye exam: 03/2019    Patient's medical history, social history, family history and surgical history have been entered into the patient's chart by my staff and/or myself and has been reviewed.    Medications/supplement taken, if any, reviewed. Medication list reviewed    Recommended screening exams discussed    Colon cancer screening discussed.    Vaccine hx/recommendations reviewed    PHQ2 QUESTIONNAIRE 06/26/2019  08/30/2017   Little interest or pleasure in doing things 0 3   Feeling down, depressed, or hopeless 0 2   Trouble falling or staying asleep, or sleeping too much 0 1   Feeling tired or having little energy 0 3   Poor appetite or overeating 1 0   Feeling bad about yourself--or that you are a failure to have let yourself or your family down 0 1   Trouble concentrating on things, such as reading the newspaper or watching television 0 1   Moving or speaking so slowly that other people could have noticed.  Or the opposite--being so fidgety or restless that you have been moving around a lot more than usual 0 0   Thoughts that you would be better off dead, or of hurting yourself in some way 0 0   If you checked off any problems, how difficult have these problems made it for you to do your work, take care of things at home, or get along with other people? Not difficult at all Somewhat difficult   PHQ9 Patient Summary Score (calculated) 1 11     FM PHQ9 score 06/26/2019 08/30/2017  PHQ9 Patient Summary Score (calculated) 1 11   Some recent data might be hidden       GAD 7 10/21/2016 06/26/2019   1. Feeling nervous, anxious or on edge 1 0   2. Not being able to stop or control worrying 0 0   3. Worrying too much about different things 2 1   4. Trouble relaxing 1 0   5. Being so restless that it is hard to sit still 0 0   6. Being easily annoyed or irritable 2 0   7. Feeling afraid as if something awful might happen 0 1   GAD7 Patient Total 6 2   If you checked off any problems, how difficult have these problems made it for you to do your job along with other people? Somewhat difficult Not difficult at all   Some recent data might be hidden                   PROBLEM  LIST:  Patient Active Problem List   Diagnosis   . Anxiety   . Herpes zoster without complication   . Mass of left foot   . Recurrent sinusitis   . H/O colonoscopy   . Family history of malignant neoplasm of colon   . Tendinitis of wrist   . Osteoarthrosis   . Carpal  tunnel syndrome   . Allergic rhinitis   . Anaclitic depression   . Fatigue, unspecified type   . Food intolerance   . Episode of moderate major depression (CMS-HCC)   . Weight gain   . Encounter by telehealth for suspected COVID-19   . Mild intermittent asthma with exacerbation   . Cough   . Screening mammogram, encounter for   . Screening for metabolic disorder   . Colon cancer screening   . Major depressive disorder, recurrent episode, mild (CMS-HCC)   . Well adult exam   . Trigger finger of right hand   . Obesity (BMI 30.0-34.9)   . Heart palpitations   . Inflamed seborrheic keratosis         PAST MEDICAL HISTORY:  Past Medical History:   Diagnosis Date   . Anxiety    . Chest congestion 01/13/2017   . Major depressive disorder, single episode    . Recurrent sinusitis 01/13/2017   . Sinus pressure 12/20/2016       PAST SURGICAL HISTORY:  Past Surgical History:   Procedure Laterality Date   . SINUS SURGERY  04/17/2017   . COLONOSCOPY  11/24/2016    repeat in 5 years   . ANTERIOR CRUCIATE LIGAMENT REPAIR Left    . CARPAL TUNNEL RELEASE Bilateral    . CESAREAN SECTION, CLASSIC  1984, 1986   . TONSILLECTOMY AND ADENOIDECTOMY          FAMILY HISTORY:   Family History   Problem Relation Name Age of Onset   . Cancer Mother          Lymphoma   . Hypertension Mother     . Cancer Father          Colon cancer, tumer to neck carcoma   . Hypertension Father     . Cholesterol/Lipid Disorder Father     . Hypertension Brother           SOCIAL HISTORY:  Social History     Socioeconomic History   . Marital status: Married     Spouse name: Not on file   . Number of children:  2   . Years of education: 2   . Highest education level: Not on file   Occupational History   . Occupation: Retired / Optometrist   Tobacco Use   . Smoking status: Never Smoker   . Smokeless tobacco: Never Used   Substance and Sexual Activity   . Alcohol use: Yes     Comment: occ   . Drug use: No   . Sexual activity: Not on file   Other Topics Concern   .  Not on file   Social History Narrative   . Not on file     Social Determinants of Health     Financial Resource Strain:    . Difficulty of Paying Living Expenses:    Food Insecurity:    . Worried About Programme researcher, broadcasting/film/video in the Last Year:    . Barista in the Last Year:    Transportation Needs:    . Freight forwarder (Medical):    Aaron Aas Lack of Transportation (Non-Medical):    Physical Activity:    . Days of Exercise per Week:    . Minutes of Exercise per Session:    Stress:    . Feeling of Stress :    Social Connections:    . Frequency of Communication with Friends and Family:    . Frequency of Social Gatherings with Friends and Family:    . Attends Religious Services:    . Active Member of Clubs or Organizations:    . Attends Banker Meetings:    Aaron Aas Marital Status:    Intimate Partner Violence:    . Fear of Current or Ex-Partner:    . Emotionally Abused:    Aaron Aas Physically Abused:    . Sexually Abused:      Social History     Tobacco Use   Smoking Status Never Smoker   Smokeless Tobacco Never Used      Social History     Substance and Sexual Activity   Alcohol Use Yes    Comment: occ      Social History     Substance and Sexual Activity   Drug Use No        Immunization History   Administered Date(s) Administered   . (Shingles) Herpes Zoster Vaccine (ZOSTAVAX) 11/10/2011   . Influenza Vaccine (Unspecified) 10/31/1996, 11/30/2007, 11/06/2009, 09/29/2010, 11/19/2013   . Influenza Vaccine >=6 Months 10/23/2008, 12/19/2011, 12/10/2012, 10/10/2016   . Pneumococcal 23 Vaccine (PNEUMOVAX-23) 09/29/2010   . Tdap 08/31/2009         CURRENT  MEDICATIONS:  Current Outpatient Medications on File Prior to Visit   Medication Sig Dispense Refill   . albuterol  108 (90 Base) MCG/ACT inhaler Inhale 2 puffs by mouth every 6 hours as needed for Wheezing. 3 Inhaler 0   . ALPRAZolam  (XANAX ) 0.25 MG tablet Take 1 tablet (0.25 mg) by mouth nightly as needed for Anxiety. 5 tablet 0   . fluticasone  propionate (FLONASE ) 50  MCG/ACT nasal spray Spray 1 spray into each nostril daily.       No current facility-administered medications on file prior to visit.     No outpatient medications have been marked as taking for the 06/26/19 encounter (Office Visit) with Clorinda Daniel, MD.        ALLERGIES:    Allergies   Allergen Reactions   . Genecof-Xp Hives   . Penicillin G Rash   . Penicillins Rash   . Dilaudid  [  Hydromorphone Hcl] Itching   . Hydrocodone Itching   . Venlafaxine Unspecified     Felt weird          REVIEW OF SYSTEMS:  Review of Systems   Constitutional: Negative for chills and fever.   HENT: Negative for congestion, facial swelling, sinus pain and voice change.    Eyes: Negative for visual disturbance.   Respiratory: Negative for cough, chest tightness and shortness of breath.    Cardiovascular: Negative for leg swelling.   Gastrointestinal: Negative for abdominal pain and constipation.   Endocrine: Negative.    Genitourinary: Negative for difficulty urinating, frequency and urgency.   Skin: Negative for color change and rash.   Allergic/Immunologic: Negative.    Neurological: Negative for dizziness and headaches.   Hematological: Negative.    Psychiatric/Behavioral: Negative for agitation, behavioral problems and suicidal ideas.              PHYSICAL EXAM:   06/26/19  0909   BP: (!) 142/98   Pulse: 84   Resp: 12   Temp: 98.6 F (37 C)     Body mass index is 30.16 kg/m.    Ht Readings from Last 1 Encounters:   06/26/19 5' 3.5" (1.613 m)     Wt Readings from Last 1 Encounters:   06/26/19 78.5 kg (173 lb)           Physical Exam  Vitals and nursing note reviewed.   Constitutional:       General: She is not in acute distress.     Appearance: Normal appearance. She is well-developed. She is obese. She is not ill-appearing.   HENT:      Right Ear: Tympanic membrane, ear canal and external ear normal.      Left Ear: Tympanic membrane, ear canal and external ear normal.      Nose: Nose normal.      Mouth/Throat:      Mouth: Mucous  membranes are moist.      Pharynx: Oropharynx is clear.   Eyes:      Conjunctiva/sclera: Conjunctivae normal.      Pupils: Pupils are equal, round, and reactive to light.   Cardiovascular:      Rate and Rhythm: Normal rate and regular rhythm.      Heart sounds: Normal heart sounds. No murmur heard.   No friction rub. No gallop.    Pulmonary:      Effort: Pulmonary effort is normal. No respiratory distress.      Breath sounds: Normal breath sounds. No wheezing or rales.   Chest:      Chest wall: No tenderness.   Musculoskeletal:      Right lower leg: No edema.      Left lower leg: No edema.   Lymphadenopathy:      Cervical: No cervical adenopathy.   Skin:     Comments: Inflamed and irritated SK on left eyebrow and right temple area   Neurological:      Mental Status: She is alert and oriented to person, place, and time.   Psychiatric:         Mood and Affect: Mood normal.         Behavior: Behavior normal.         Thought Content: Thought content normal.         Judgment: Judgment normal.                  ASSESSMENT & PLAN:    Mana  was seen today for physical.    Diagnoses and all orders for this visit:    Well adult exam  Assessment & Plan:  . Stable  . Optometry/Ophthalmology appointment advised if needed  . Yearly dental appointments advised.   . Colon cancer screening (ie sigmoidoscopy, colonoscopy or occult blood) UTD  . Immunizations discussed  and given/ordered if due and desired  . Laboratory tests  ordered  if due  . Healthy diet and routine physical activity advised.           Heart palpitations  Assessment & Plan:  Intermittent  EKG done in office, see results  Referred to cardiology for further evaluation  Pt reassured  Will continue to monitor      Orders:  -     ($) ECG  -     Cardiology Clinic    Trigger finger of right hand, unspecified finger  Assessment & Plan:  Persistent  Recommend OTC diclofenac gel and turmeric  Will continue to monitor        Obesity (BMI 30.0-34.9)  Assessment &  Plan:  Active  Continue Weight Watchers  Encouraged patient to continue w/ weight loss journey  Will continue to monitor        Inflamed seborrheic keratosis  Assessment & Plan:  Active  Cryotherapy done to two lesions  Pt tolerated procedure well  Will continue to monitor      Orders:  -     Cryotherapy        ICD-10-CM ICD-9-CM    1. Well adult exam  Z00.00 V70.0    2. Heart palpitations  R00.2 785.1 ($) ECG      Cardiology Clinic   3. Trigger finger of right hand, unspecified finger  M65.30 727.03    4. Obesity (BMI 30.0-34.9)  E66.9 278.00    5. Inflamed seborrheic keratosis  L82.0 702.11 Cryotherapy         By signing below, I acknowledge that I have reviewed the above note, dictated by me and scribed by [facility], for accuracy and edited  where necessary. The note accurately reflects the services provided at this  encounter. We retain the right to modify this information in the event of  errors. Occasional errors in punctuation, grammar and content may occur.      [facility]  [email or web address]

## 2019-06-26 NOTE — Assessment & Plan Note (Signed)
 Persistent  Recommend OTC diclofenac gel and turmeric  Will continue to monitor

## 2019-06-26 NOTE — Assessment & Plan Note (Signed)
 Active  Cryotherapy done to two lesions  Pt tolerated procedure well  Will continue to monitor

## 2019-06-26 NOTE — Assessment & Plan Note (Signed)
 Active  Continue Weight Watchers  Encouraged patient to continue w/ weight loss journey  Will continue to monitor

## 2019-06-26 NOTE — Assessment & Plan Note (Signed)
 Intermittent  EKG done in office, see results  Referred to cardiology for further evaluation  Pt reassured  Will continue to monitor

## 2019-06-26 NOTE — Procedures (Signed)
 Destruction of Lesion Seborrhea Keratosis (SK)      Diagnosis: Seborrhea Keratosis, Inflamed and irritating     Quantity 2     Location: left eyebrow and right temple      PROCEDURE: Liquid Nitrogen Therapy    The lesions were  destroyed with cryotherapy using liquid nitrogen. I explained that it may take several freezing's for the lesions to resolve.     Reviewed and Signed By Clorinda Daniel, MD

## 2019-06-30 ENCOUNTER — Encounter (INDEPENDENT_AMBULATORY_CARE_PROVIDER_SITE_OTHER): Payer: Self-pay | Admitting: Family Medicine

## 2019-07-01 NOTE — Telephone Encounter (Signed)
 Please Follow up with referral- [facility]

## 2019-07-05 ENCOUNTER — Encounter (INDEPENDENT_AMBULATORY_CARE_PROVIDER_SITE_OTHER): Payer: Self-pay | Admitting: Hospital

## 2019-07-11 ENCOUNTER — Encounter (INDEPENDENT_AMBULATORY_CARE_PROVIDER_SITE_OTHER): Payer: Self-pay

## 2019-07-16 ENCOUNTER — Encounter (INDEPENDENT_AMBULATORY_CARE_PROVIDER_SITE_OTHER): Payer: Self-pay | Admitting: Family Medicine

## 2019-08-10 ENCOUNTER — Other Ambulatory Visit: Payer: Self-pay

## 2019-08-14 ENCOUNTER — Ambulatory Visit (INDEPENDENT_AMBULATORY_CARE_PROVIDER_SITE_OTHER): Admitting: Family Medicine

## 2019-08-14 VITALS — BP 132/80 | HR 76 | Temp 98.8°F | Resp 14 | Ht 63.5 in | Wt 172.0 lb

## 2019-08-14 MED ORDER — TIZANIDINE HCL 2 MG OR TABS
2.0000 mg | ORAL_TABLET | Freq: Three times a day (TID) | ORAL | 0 refills | Status: DC
Start: 2019-08-14 — End: 2020-04-07

## 2019-08-20 ENCOUNTER — Encounter (INDEPENDENT_AMBULATORY_CARE_PROVIDER_SITE_OTHER): Payer: Self-pay | Admitting: Family Medicine

## 2019-08-27 ENCOUNTER — Encounter (INDEPENDENT_AMBULATORY_CARE_PROVIDER_SITE_OTHER): Payer: Self-pay | Admitting: Family Medicine

## 2019-08-27 DIAGNOSIS — G8929 Other chronic pain: Secondary | ICD-10-CM

## 2019-09-02 ENCOUNTER — Encounter (INDEPENDENT_AMBULATORY_CARE_PROVIDER_SITE_OTHER): Payer: Self-pay | Admitting: Family Medicine

## 2019-09-10 ENCOUNTER — Other Ambulatory Visit (INDEPENDENT_AMBULATORY_CARE_PROVIDER_SITE_OTHER): Payer: Self-pay | Admitting: Family Medicine

## 2019-09-10 ENCOUNTER — Telehealth (INDEPENDENT_AMBULATORY_CARE_PROVIDER_SITE_OTHER): Payer: Self-pay | Admitting: Family Medicine

## 2019-09-10 MED ORDER — ALBUTEROL SULFATE 108 (90 BASE) MCG/ACT IN AERS
2.0000 | INHALATION_SPRAY | Freq: Four times a day (QID) | RESPIRATORY_TRACT | 1 refills | Status: DC | PRN
Start: 2019-09-10 — End: 2021-04-22

## 2019-09-11 NOTE — Telephone Encounter (Signed)
 Spoke with patient today in reference to message below.  Patient's referral has been approved.  Patient was notified by [facility] this morning already.  Patient pleased.

## 2019-10-08 ENCOUNTER — Other Ambulatory Visit: Payer: Self-pay

## 2019-10-09 ENCOUNTER — Ambulatory Visit (INDEPENDENT_AMBULATORY_CARE_PROVIDER_SITE_OTHER): Admitting: Family Medicine

## 2019-10-09 VITALS — BP 126/79 | HR 74 | Temp 98.5°F | Resp 14 | Ht 63.5 in | Wt 171.0 lb

## 2019-10-14 ENCOUNTER — Encounter (INDEPENDENT_AMBULATORY_CARE_PROVIDER_SITE_OTHER): Payer: Self-pay

## 2019-10-16 ENCOUNTER — Encounter (INDEPENDENT_AMBULATORY_CARE_PROVIDER_SITE_OTHER): Payer: Self-pay

## 2019-12-26 ENCOUNTER — Telehealth (INDEPENDENT_AMBULATORY_CARE_PROVIDER_SITE_OTHER): Admitting: Medical

## 2019-12-26 VITALS — Ht 64.0 in | Wt 170.0 lb

## 2019-12-26 NOTE — Assessment & Plan Note (Addendum)
 Active  Scheduled for nasal COVID testing, if positive test f/u with [facility] for Monoclonal treatment    Recommend rest, increase fluids, wash hands, quarantine  Advised to take Vit D, Vit C, zinc supplements  ER / UC precautions discussed   Monitoring

## 2019-12-26 NOTE — Progress Notes (Signed)
 [facility]  [place] - [place]-  [place] - [place] - [place]   [email or web address] and [email or web address]  Telephone: [phone]                 Encounter Date:  12/26/19  9:30 AM   PCP: Adrienne Mocha   MRN: [id number]  DOB: [birth year]    [facility] VIDEO CALL SERVICES ENCOUNTER  Pandemic Response Ambulatory Protocol    Evaluator(s):   Debbie Hudson is a 64 year old female  who was evaluated by: Physician Assistant (via telemedicine)    Statement:    A Relevant History (including allergies, medications, past medical history, relevant review of systems) and relevant exam as performed by the named provider, are as transcribed in this and/or the accompanying note. Please also see the patient questionnaire for details.    Patient Verification & Video medicine Consent:      -I have verified that the patient's identification to be correct via verbal confirmation of birth date and address & valid: Yes    -The patient, and / or surrogate, has been made aware that patient is to be evaluated today using a home video medicine visit technique (audio, video & digital data transmission): Yes    - The patient, and / or surrogate, has been made aware that patient has the right to refuse this type of evaluation at any time during the assessment period, and has been made aware of any alternatives to this type of evaluation: Yes    -The patient, and / or surrogate,  has been made aware that patient may need further evaluations in the future: Yes    -The patient, and / or surrogate, has signed a valid Informed Consent document (detailing risks, benefits, alternatives & costs), or is exempt from these requirements by law, which I verify is currently present in the [place] MEDICAL RECORD NUMBERYes    Patient is unable to be seen by a specialist in this clinic because:      Pandemic response ambulatory protocol.  Due to COVID-19 pandemic and a federally declared state of public health emergency,  this service is being conducted via video call. Patient is at risk.                    HPI:  Debbie Hudson is a 64 year old female presents at Home for the following:   Chief Complaint   Patient presents with   . Congestion     x Suday pt has been sick, pt is having fatigue, headache, eyes were achy, loss of taste, congested     Viral Syndrome  Pt c/o head congestion/fatigue/loss of taste and smell since 12/22/19  Pt states 12/5 and 12/6 felt flu like sx onset with malaise, 12/25/19 sx started improving but then started with headache  Pt states she eat a piece of chocolate last night and could not taste it   Pt states she took sudafed and still having loss of taste and smell with this mornings breakfast  Pt state she does not think she had exposure to COVID   Pt states she had covid 19 earlier this year  Pt states she takes vit d, vit c, and zinc   Pt states she has not been tested recently for COVID 19   Denies fever, chest pain, dyspnea or cough        PROBLEM  LIST:  Patient Active Problem List   Diagnosis   . Anxiety   .  Herpes zoster without complication   . Mass of left foot   . Recurrent sinusitis   . H/O colonoscopy   . Family history of malignant neoplasm of colon   . Tendinitis of wrist   . Osteoarthrosis   . Carpal tunnel syndrome   . Allergic rhinitis   . Anaclitic depression   . Fatigue, unspecified type   . Food intolerance   . Weight gain   . Mild intermittent asthma with exacerbation   . Major depressive disorder, recurrent episode, mild (CMS-HCC)   . Trigger finger of right hand   . Obesity (BMI 30.0-34.9)   . Heart palpitations   . Inflamed seborrheic keratosis   . Back spasm   . Neck muscle spasm   . Pain of left calf   . Viral syndrome         PAST MEDICAL HISTORY:  Past Medical History:   Diagnosis Date   . Anxiety    . Chest congestion 01/13/2017   . Major depressive disorder, single episode    . Recurrent sinusitis 01/13/2017   . Sinus pressure 12/20/2016       SOCIAL HISTORY:  Social History      Socioeconomic History   . Marital status: Married     Spouse name: Not on file   . Number of children: 2   . Years of education: 2   . Highest education level: Not on file   Occupational History   . Occupation: Retired / Optometrist   Tobacco Use   . Smoking status: Never Smoker   . Smokeless tobacco: Never Used   Substance and Sexual Activity   . Alcohol use: Yes     Comment: occ   . Drug use: No   . Sexual activity: Not on file   Other Topics Concern   . Not on file   Social History Narrative   . Not on file     Social Determinants of Health     Financial Resource Strain:    . Difficulty of Paying Living Expenses: Not on file   Food Insecurity:    . Worried About Programme researcher, broadcasting/film/video in the Last Year: Not on file   . Ran Out of Food in the Last Year: Not on file   Transportation Needs:    . Lack of Transportation (Medical): Not on file   . Lack of Transportation (Non-Medical): Not on file   Physical Activity:    . Days of Exercise per Week: Not on file   . Minutes of Exercise per Session: Not on file   Stress:    . Feeling of Stress : Not on file   Social Connections:    . Frequency of Communication with Friends and Family: Not on file   . Frequency of Social Gatherings with Friends and Family: Not on file   . Attends Religious Services: Not on file   . Active Member of Clubs or Organizations: Not on file   . Attends Banker Meetings: Not on file   . Marital Status: Not on file   Intimate Partner Violence:    . Fear of Current or Ex-Partner: Not on file   . Emotionally Abused: Not on file   . Physically Abused: Not on file   . Sexually Abused: Not on file   Housing Stability:    . Unable to Pay for Housing in the Last Year: Not on file   . Number of Places Lived in  the Last Year: Not on file   . Unstable Housing in the Last Year: Not on file     Social History     Tobacco Use   Smoking Status Never Smoker   Smokeless Tobacco Never Used      Social History     Substance and Sexual Activity    Alcohol Use Yes    Comment: occ      Social History     Substance and Sexual Activity   Drug Use No        Immunization History   Administered Date(s) Administered   . (Shingles) Herpes Zoster Vaccine (ZOSTAVAX) 11/10/2011   . Influenza Vaccine (Unspecified) 10/31/1996, 11/30/2007, 11/06/2009, 09/29/2010, 11/19/2013   . Influenza Vaccine >=6 Months 10/23/2008, 12/19/2011, 12/10/2012, 10/10/2016   . Pneumococcal 23 Vaccine (PNEUMOVAX-23) 09/29/2010   . Tdap 08/31/2009         CURRENT  MEDICATIONS:  Current Outpatient Medications on File Prior to Visit   Medication Sig Dispense Refill   . albuterol 108 (90 Base) MCG/ACT inhaler Inhale 2 puffs by mouth every 6 hours as needed for Wheezing. 3 each 1   . ALPRAZolam (XANAX) 0.25 MG tablet Take 1 tablet (0.25 mg) by mouth nightly as needed for Anxiety. 5 tablet 0   . fluticasone propionate (FLONASE) 50 MCG/ACT nasal spray Spray 1 spray into each nostril daily.     . tizanidine (ZANAFLEX) 2 MG tablet Take 1 tablet (2 mg) by mouth 3 times daily. As needed muscle spasm 90 tablet 0     No current facility-administered medications on file prior to visit.     Outpatient Medications Marked as Taking for the 12/26/19 encounter [facility]) with Elon Alas, [name]   Medication Sig Dispense Refill   . albuterol 108 (90 Base) MCG/ACT inhaler Inhale 2 puffs by mouth every 6 hours as needed for Wheezing. 3 each 1   . ALPRAZolam (XANAX) 0.25 MG tablet Take 1 tablet (0.25 mg) by mouth nightly as needed for Anxiety. 5 tablet 0   . fluticasone propionate (FLONASE) 50 MCG/ACT nasal spray Spray 1 spray into each nostril daily.          ALLERGIES:    Allergies   Allergen Reactions   . Genecof-Xp Hives   . Penicillin G Rash   . Penicillins Rash   . Dilaudid  [Hydromorphone Hcl] Itching   . Hydrocodone Itching   . Venlafaxine Unspecified     Felt weird          REVIEW OF SYSTEMS:  Review of Systems   Constitutional: Positive for fatigue. Negative for fever.   HENT: Positive  for congestion.         Loss of taste and smell   Eyes: Positive for pain.   Respiratory: Negative for cough and shortness of breath.    Cardiovascular: Negative for chest pain, palpitations and leg swelling.   Gastrointestinal: Negative for abdominal pain.   Skin: Negative for rash.   Neurological: Positive for headaches.                      PHYSICAL EXAM:    There were no vitals filed for this visit.  Body mass index is 29.18 kg/m.    Ht Readings from Last 1 Encounters:   12/26/19 5\' 4"  (1.626 m)     Wt Readings from Last 1 Encounters:   12/26/19 77.1 kg (170 lb)         General Impression:  Looks healthy, alert, no distress, pleasant affect, cooperative, communicative. Speech within normal limits. No signs of neurological decifits seen.                 ASSESSMENT & PLAN:    Roland was seen today for congestion.    Diagnoses and all orders for this visit:    Viral syndrome  Assessment & Plan:  Active  Scheduled for nasal COVID testing, if positive test f/u with [facility] for Monoclonal treatment    Recommend rest, increase fluids, wash hands, quarantine  Advised to take Vit D, Vit C, zinc supplements  ER / UC precautions discussed   Monitoring             ICD-10-CM ICD-9-CM    1. Viral syndrome  B34.9 079.99        Patient instructions:  See EPIC instructions.  Reviewed verbally and/or AVS available via MYchart for patient.      Barriers to learning assessed: None.    Patient/family verbalizes understanding and is agreeable to above plan.    Patient was evaluated by PA-C Tennile Styles at [facility] [place]    TIME SPENT ON MEDICAL DISCUSSION: 12 mins  Start time: 10:48 am  Stop time: 11:00 am    By signing below, I acknowledge that I have reviewed the above note,  dictated by me and scribed by Ashley Chocobar, for accuracy and edited  where necessary. The note accurately reflects the services provided at this  encounter. We retain the right to modify this information in the event of  errors. Occasional errors in  punctuation, grammar and content may occur.         Electronically signed and reviewed by PA-C Rosslyn Coons      [facility]  [email or web address]  [id number]

## 2020-01-05 ENCOUNTER — Encounter (INDEPENDENT_AMBULATORY_CARE_PROVIDER_SITE_OTHER): Payer: Self-pay | Admitting: Family Medicine

## 2020-01-06 ENCOUNTER — Encounter (INDEPENDENT_AMBULATORY_CARE_PROVIDER_SITE_OTHER): Payer: Self-pay | Admitting: Family Medicine

## 2020-01-06 MED ORDER — AZITHROMYCIN 250 MG OR TABS
ORAL_TABLET | ORAL | 0 refills | Status: AC
Start: 2020-01-06 — End: 2020-01-11

## 2020-01-06 NOTE — Telephone Encounter (Signed)
 From: Chancy Comber  To: Clorinda Daniel, MD  Sent: 01/05/2020 9:28 PM PST  Subject: Sinus issues    Hello,  I did a tele call on Dec 9 with the PA c/o of some sinus and other issues. I did a home test for Covid which was negative, however, I continued to not feel great until Last Monday Dec 13. Then this Saturday night my right ear and the upper right side of my face started to feel congested and plugged . I took a decongestant today (Sunday), but it did not help much.Aaron AasAaron AasI'm wondering if I have a sinus infection....wondering if I need a Zpac or? because of all my past sinus issues. Please let me know what I should do.Aaron AasAaron AasI have no other symptoms except slight fatigue... and no fever or chills. Thank you.
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
## 2020-04-07 ENCOUNTER — Encounter (INDEPENDENT_AMBULATORY_CARE_PROVIDER_SITE_OTHER): Payer: Self-pay

## 2020-04-07 ENCOUNTER — Ambulatory Visit (INDEPENDENT_AMBULATORY_CARE_PROVIDER_SITE_OTHER): Admitting: Family Practice

## 2020-04-07 ENCOUNTER — Encounter (INDEPENDENT_AMBULATORY_CARE_PROVIDER_SITE_OTHER): Payer: Self-pay | Admitting: Family Practice

## 2020-04-07 VITALS — BP 132/82 | HR 86 | Temp 97.6°F | Resp 16 | Ht 64.0 in | Wt 174.8 lb

## 2020-04-07 MED ORDER — AZITHROMYCIN 250 MG OR TABS
ORAL_TABLET | ORAL | Status: DC
Start: 2020-01-06 — End: 2020-04-07

## 2020-04-07 MED ORDER — LISINOPRIL 5 MG OR TABS
5.0000 mg | ORAL_TABLET | Freq: Every day | ORAL | 1 refills | Status: DC
Start: 2020-04-07 — End: 2020-05-05

## 2020-04-07 NOTE — Assessment & Plan Note (Addendum)
 Stable  PHQ-9 completed in clinic today:   PHQ2 TOTAL SCORE 04/07/2020 06/26/2019 08/30/2017   PHQ9 Patient Summary Score (calculated) 6 1 11      Patient advised to increase exercise and engage in stress reducing activity for overall well being.  Patient currently denies any homicidal or suicidal ideation.   I have instructed the patient to go to the Emergency Room or call 911 if they develop any suicidal or homicidal thought.   Continue to monitor

## 2020-04-07 NOTE — Assessment & Plan Note (Addendum)
 Controlled.   Blood pressure readings reviewed:  Blood Pressure   04/07/20 132/82   10/09/19 126/79   08/14/19 132/80      START lisinopril 5 mg  Advised to perform home blood pressure readings as well as keep a record of the readings.  Patient should be increasing exercise and work on utilizing a healthy diet.   F/u to evaluate for disease progression.  Monitor

## 2020-04-07 NOTE — Progress Notes (Signed)
 Encounter Date:  04/07/20  5:20 PM   PCP: Adrienne Mocha  MRN: [id number]  DOB: 1955/01/21         HPI:  Debbie Hudson is a 65 year old female presents for the following:   Chief Complaint   Patient presents with   . Dizziness     65 y/o Female in office today due to dizzy spells she has been having since Wednesday. Pt states the first one she had was the worst really fatigue afterwards made her nauses too.    Marland Kitchen Hypertension     Pt states her blood presssure has been going up since the dizzy spells.      #Hypertension   Patient has hypertension.   Blood pressure readings reviewed:  Blood Pressure   04/07/20 132/82   10/09/19 126/79   08/14/19 132/80   Not on antihypertensives    #dizziness  Patient c/o dizzy spells and lightheadedness  Patient laid down on left side and felt room start spinning  States head felt full the next morning   Patient's husband states she has been snoring more recently   Denies CP/SOB/urinary abnormalities   No hx of vertigo   Denies increased stress levels   Wakes up with slight headache   Patient has gained weight recently     #depression  Patient currently on no medications with no complications  Denies SI/HI  [facility] PHQ9 DEPRESSION QUESTIONNAIRE 08/30/2017 06/26/2019 04/07/2020   Interest 3 0 1   Depressed 2 0 0   Sleep 1 0 0   Energy 3 0 2   Appetite 0 1 2   Failure 1 0 0   Concentration 1 0 1   Movement 0 0 0   Suicide 0 0 0   Summary(Manual) -- -- --   Summary(Calculated) 11 1 6    Functional Somewhat difficult Not difficult at all Not difficult at all   Some recent data might be hidden        PROBLEM  LIST:  Patient Active Problem List   Diagnosis   . Anxiety   . Herpes zoster without complication   . Mass of left foot   . Recurrent sinusitis   . H/O colonoscopy   . Family history of malignant neoplasm of colon   . Tendinitis of wrist   . Osteoarthrosis   . Carpal tunnel syndrome   . Allergic rhinitis   . Anaclitic depression   . Fatigue, unspecified type   . Food intolerance   . Weight gain   .  Mild intermittent asthma with exacerbation   . Major depressive disorder, recurrent episode, mild (CMS-HCC)   . Trigger finger of right hand   . Obesity (BMI 30.0-34.9)   . Heart palpitations   . Inflamed seborrheic keratosis   . Back spasm   . Neck muscle spasm   . Pain of left calf   . Viral syndrome   . Hypertension, unspecified type   . Dizziness         PAST MEDICAL HISTORY:  Past Medical History:   Diagnosis Date   . Anxiety    . Chest congestion 01/13/2017   . Major depressive disorder, single episode    . Recurrent sinusitis 01/13/2017   . Sinus pressure 12/20/2016       PAST SURGICAL HISTORY:  Past Surgical History:   Procedure Laterality Date   . SINUS SURGERY  04/17/2017   . COLONOSCOPY  11/24/2016    repeat in 5 years   .  ANTERIOR CRUCIATE LIGAMENT REPAIR Left    . CARPAL TUNNEL RELEASE Bilateral    . CESAREAN SECTION, CLASSIC  1984, 1986   . TONSILLECTOMY AND ADENOIDECTOMY          FAMILY HISTORY:   Family History   Problem Relation Name Age of Onset   . Cancer Mother          Lymphoma   . Hypertension Mother     . Cancer Father          Colon cancer, tumer to neck carcoma   . Hypertension Father     . Cholesterol/Lipid Disorder Father     . Hypertension Brother           SOCIAL HISTORY:  Social History     Socioeconomic History   . Marital status: Married     Spouse name: Not on file   . Number of children: 2   . Years of education: 2   . Highest education level: Not on file   Occupational History   . Occupation: Retired / Optometrist   Tobacco Use   . Smoking status: Never Smoker   . Smokeless tobacco: Never Used   Substance and Sexual Activity   . Alcohol use: Yes     Comment: occ   . Drug use: No   . Sexual activity: Not on file   Other Topics Concern   . Not on file   Social History Narrative   . Not on file     Social Determinants of Health     Financial Resource Strain: Not on file   Food Insecurity: Not on file   Transportation Needs: Not on file   Physical Activity: Not on file   Stress:  Not on file   Social Connections: Not on file   Intimate Partner Violence: Not on file   Housing Stability: Not on file     Social History     Tobacco Use   Smoking Status Never Smoker   Smokeless Tobacco Never Used      Social History     Substance and Sexual Activity   Alcohol Use Yes    Comment: occ      Social History     Substance and Sexual Activity   Drug Use No        Immunization History   Administered Date(s) Administered   . (Shingles) Herpes Zoster Vaccine (ZOSTAVAX) 11/10/2011   . Influenza Vaccine (Unspecified) 10/31/1996, 11/30/2007, 11/06/2009, 09/29/2010, 11/19/2013   . Influenza Vaccine >=6 Months 10/23/2008, 12/19/2011, 12/10/2012, 10/10/2016   . Pneumococcal 23 Vaccine (PNEUMOVAX-23) 09/29/2010   . Tdap 08/31/2009         CURRENT  MEDICATIONS:  Current Outpatient Medications on File Prior to Visit   Medication Sig Dispense Refill   . albuterol 108 (90 Base) MCG/ACT inhaler Inhale 2 puffs by mouth every 6 hours as needed for Wheezing. 3 each 1   . ALPRAZolam (XANAX) 0.25 MG tablet Take 1 tablet (0.25 mg) by mouth nightly as needed for Anxiety. 5 tablet 0   . [DISCONTINUED] azithromycin (ZITHROMAX) 250 MG tablet      . fluticasone propionate (FLONASE) 50 MCG/ACT nasal spray Spray 1 spray into each nostril daily.     . [DISCONTINUED] tizanidine (ZANAFLEX) 2 MG tablet Take 1 tablet (2 mg) by mouth 3 times daily. As needed muscle spasm 90 tablet 0     No current facility-administered medications on file prior to visit.  Outpatient Medications Marked as Taking for the 04/07/20 encounter (Office Visit) with Marletta Lor, DO   Medication Sig Dispense Refill   . albuterol 108 (90 Base) MCG/ACT inhaler Inhale 2 puffs by mouth every 6 hours as needed for Wheezing. 3 each 1   . ALPRAZolam (XANAX) 0.25 MG tablet Take 1 tablet (0.25 mg) by mouth nightly as needed for Anxiety. 5 tablet 0   . fluticasone propionate (FLONASE) 50 MCG/ACT nasal spray Spray 1 spray into each nostril daily.          ALLERGIES:     Allergies   Allergen Reactions   . Genecof-Xp Hives   . Penicillin G Rash   . Penicillins Rash   . Dilaudid  [Hydromorphone Hcl] Itching   . Hydrocodone Itching   . Venlafaxine Unspecified     Felt weird          REVIEW OF SYSTEMS:  Review of Systems  ROS negative except HPI.    PHYSICAL EXAM:   04/07/20  1723   BP: 132/82   Pulse: 86   Resp: 16   Temp: 97.6 F (36.4 C)   SpO2: 96%     Body mass index is 30 kg/m.    Ht Readings from Last 1 Encounters:   04/07/20 5\' 4"  (1.626 m)     Wt Readings from Last 1 Encounters:   04/07/20 79.3 kg (174 lb 12.8 oz)           Physical Exam  Vitals and nursing note reviewed.   Constitutional:       Appearance: Normal appearance. She is normal weight.   HENT:      Head: Normocephalic and atraumatic.      Right Ear: There is no impacted cerumen.      Left Ear: There is no impacted cerumen.   Eyes:      Extraocular Movements: Extraocular movements intact.      Conjunctiva/sclera: Conjunctivae normal.      Pupils: Pupils are equal, round, and reactive to light.   Cardiovascular:      Rate and Rhythm: Normal rate and regular rhythm.      Pulses: Normal pulses.      Heart sounds: Normal heart sounds.   Pulmonary:      Effort: Pulmonary effort is normal.      Breath sounds: Normal breath sounds.   Abdominal:      General: Abdomen is flat. Bowel sounds are normal.      Palpations: Abdomen is soft.   Neurological:      General: No focal deficit present.      Mental Status: She is alert and oriented to person, place, and time.   Psychiatric:         Mood and Affect: Mood normal.         Behavior: Behavior normal.             ASSESSMENT & PLAN:    Averleigh was seen today for dizziness and hypertension.    Diagnoses and all orders for this visit:    Hypertension, unspecified type  Assessment & Plan:  Controlled.   Blood pressure readings reviewed:  Blood Pressure   04/07/20 132/82   10/09/19 126/79   08/14/19 132/80      START lisinopril 5 mg  Advised to perform home blood pressure  readings as well as keep a record of the readings.  Patient should be increasing exercise and work on utilizing a healthy diet.   F/u to  evaluate for disease progression.  Monitor      Orders:  -     lisinopril (PRINIVIL, ZESTRIL) 5 MG tablet; Take 1 tablet (5 mg) by mouth daily.    Lightheadedness  Assessment & Plan:  Active  Unclear etiology and multifactorial  Recommend OTC allergy medication to help with congestion at night causing snoring and better night rest  Starting lisinopril 5 mg for possible start of hypertension  Consider sleep study in the future for OSA  RTC for worsening dizziness/lightheadedness  Follow up in 3 weeks for re-evaluation   F/u if symptoms persist/worsen  Continue to monitor         Major depressive disorder, recurrent episode, mild (CMS-HCC)  Assessment & Plan:  Stable  PHQ-9 completed in clinic today:   [facility] TOTAL SCORE 04/07/2020 06/26/2019 08/30/2017   PHQ9 Patient Summary Score (calculated) 6 1 11      Patient advised to increase exercise and engage in stress reducing activity for overall well being.  Patient currently denies any homicidal or suicidal ideation.   I have instructed the patient to go to the Emergency Room or call 911 if they develop any suicidal or homicidal thought.   Continue to monitor             ICD-10-CM ICD-9-CM    1. Hypertension, unspecified type  I10 401.9 lisinopril (PRINIVIL, ZESTRIL) 5 MG tablet   2. Lightheadedness  R42 780.4    3. Major depressive disorder, recurrent episode, mild (CMS-HCC)  F33.0 296.31            By signing below, I acknowledge that I have reviewed the above note,  dictated by me and scribed by Avrodet Mourkus, for accuracy and edited  where necessary. The note accurately reflects the services provided at this  encounter. We retain the right to modify this information in the event of  errors. Occasional errors in punctuation, grammar and content may occur.      Electronically reviewed and signed by Russella Dar    [facility]  [email or web address]

## 2020-04-07 NOTE — Assessment & Plan Note (Addendum)
 Active  Unclear etiology and multifactorial  Recommend OTC allergy medication to help with congestion at night causing snoring and better night rest  Starting lisinopril 5 mg for possible start of hypertension  Consider sleep study in the future for OSA  RTC for worsening dizziness/lightheadedness  Follow up in 3 weeks for re-evaluation   F/u if symptoms persist/worsen  Continue to monitor

## 2020-05-05 ENCOUNTER — Other Ambulatory Visit (INDEPENDENT_AMBULATORY_CARE_PROVIDER_SITE_OTHER): Payer: Self-pay | Admitting: Family Practice

## 2020-05-05 MED ORDER — LISINOPRIL 5 MG OR TABS
ORAL_TABLET | ORAL | 0 refills | Status: DC
Start: 2020-05-05 — End: 2020-06-10

## 2020-06-02 ENCOUNTER — Other Ambulatory Visit: Payer: Self-pay

## 2020-06-03 ENCOUNTER — Ambulatory Visit (INDEPENDENT_AMBULATORY_CARE_PROVIDER_SITE_OTHER): Admitting: Family Medicine

## 2020-06-03 ENCOUNTER — Encounter (INDEPENDENT_AMBULATORY_CARE_PROVIDER_SITE_OTHER): Payer: Self-pay | Admitting: Family Medicine

## 2020-06-03 VITALS — BP 142/82 | HR 80 | Resp 16 | Ht 64.0 in | Wt 168.2 lb

## 2020-06-03 NOTE — Assessment & Plan Note (Signed)
 Stable  Referral to dermatology placed for full skin check to rule out underlying malignancy  Continue to monitor

## 2020-06-03 NOTE — Assessment & Plan Note (Signed)
 New  With daytime fatigue and dry mouth  Suspect undiagnosed sleep apnea   Referral to sleep medicine placed for further evaluation and treatment, possible home sleep study to confirm vs rule out underlying sleep apnea  Continue to monitor

## 2020-06-03 NOTE — Assessment & Plan Note (Signed)
 Stable  Okay to start over the counter vitamin D and vitamin B12 supplementation at patient's discretion  Continue to monitor

## 2020-06-03 NOTE — Assessment & Plan Note (Signed)
 Worsening - no SIHI  Intermittent anxious mood without specific trigger suggestive of GAD  Discussed utility of counseling to learn techniques for management of anxious mood, patient defers at this time  Okay to take alprazolam as directed PRN for sudden severe anxiety, continue to limit use as often as possible to prevent developing dependence  Okay to continue essential oils if patient is seeing improvement  Continue breathing techniques as patient sees improvement  Continue to monitor  Call office if patient develops any symptoms outside of what we are trying to achieve  RTC or Go to ER if patient develops worsening dysphoric mood, anxiety or panic, thoughts of self harm, or SIHI

## 2020-06-03 NOTE — Patient Instructions (Addendum)
 Magnesium Oxide or Magnesium Glycinate daily to help with cramps     We will give you a physical therapy referral for your neck and upper back symptoms

## 2020-06-03 NOTE — Assessment & Plan Note (Signed)
 Stable  Check labs  Continue to monitor

## 2020-06-03 NOTE — Assessment & Plan Note (Signed)
 Not improving  Advised establishing with alternate physical therapy clinic for second opinion, recommendations provided  Referral to physical therapy placed for further evaluation and treatment  Advised over the counter tylenol for pain, avoid NSAID use  Advised ice therapy and heat therapy  Advised stretching, massaging affected area as directed  Limit bedrest, advised exercise as tolerable  Continue to monitor  RTC for new or worsening pain, swelling, numbness, tingling

## 2020-06-03 NOTE — Assessment & Plan Note (Signed)
 Improving  BMI 28.87  Patient counseled weight loss of 1-1.5lbs per week is considered safe limit  Patient reassured  8 lb weight loss is significant and does not warrant changing dietary regimen  Can continue optavia diet as directed as patient is seeing improvement  Counseled on clinical utility of weight and strength training in exercise regimen to facilitate weight loss  Advised regular exercise regimen of 30-45 min/day as tolerable to maintain weight  Continue to monitor

## 2020-06-03 NOTE — Assessment & Plan Note (Signed)
Stable  DEXA ordered, will call with results  Continue to monitor

## 2020-06-03 NOTE — Progress Notes (Signed)
 Encounter Date:  Wednesday Jun 03, 2020   3:11 PM    PCP: Adrienne Mocha  MRN: [id number]  DOB: Jun 08, 1955     CC:   Chief Complaint   Patient presents with   . Physical     65 yo female pt present for cpe- last colon, about 4 years ago, last mammo- last year, last pap- n/a, last eye- last year, last dental- less than 6 months ago.        HPI:  Debbie Hudson is a 65 year old female presents for medicare annual wellness.       INFO REPORTED BY PATIENT  --ADVANCED DIRECTIVE ON FILE: NO , CODE STATUS: FULL  -Capable of self care: YES  -Last colonoscopy: done 11/24/16, has 5 year repeat advised  -mammogram: done 06/27/19 no evidence of malignancy. denies breast pain, masses, skin or nipple changes  -Pap: believes it was done more than 5 years ago  -DEXA: done more than 2 years ago  -Labs: not up to date last done 06/10/19  -Eye: done in the last 1-2 years, states freckle was noted to her iris otherwise unremarkable  -Dental: not up to date   -Hearing: uses hearing aids  -Immunizations: zostavax done 11/10/11. Not up to date on TDAP or shingrix.   -Diet: states she has always struggled with her weight. Is trying optavia, has lost 8 lbs on this regimen.   -Exercise: no formal   -fall: no falls in last 6 months  -memory: no new or significant memory changes      Numerous moles  Patient requests referral to dermatology for full body skin check.  Notes diffuse moles.     Enquires about vitamin supplements she should be taking.     Snoring   Patient notes new snoring.  Patient was informed of this by husband.   Denies nocturnal awakening.  Admits to intermittent daytime fatigue.  Admits to dry mouth after waking.   Has not had sleep study done.   Notes history of sinus surgery in the 1990's.    Anxiety   Patient has diagnosis of anxiety.   Patient notes worsening intermittent anxious mood. Notes while sitting at church it will come on and she is not sure why, states she knows everyone there.   Denies depressed mood.  Patient has  prescription for alprazolam, states she tries to avoid.   She tries to treat with essential oils and breathing techniques.   She notes her mother passed away 3 years ago, notes since then she thinks about death more often, notes she often thinks about how her father will pass away, who will take care of her when she is dying.     Left leg cramping   Patient notes she had leg cramping, swelling, and bruising to left lower leg 09/2019.  She states symptoms lasted for one month.  States she had ultrasound venous done 10/09/19 noting no concerns for DVT component.   States since onset she will get leg cramping at night, states she has to get up and move around for pain to improve.     Right mid back pain   Patient notes pain to right mid back.  She was prescribed tizanidine 2mg  per Dr. Gaynelle Cage, states she had to take 2-3 nights ago due to severity of pain keeping her from sleeping.   She tried physical therapy with [facility] but did not see improvement.   Also has pain to neck.  PROBLEM  LIST:  Patient Active Problem List   Diagnosis   . Anxiety   . Herpes zoster without complication   . Mass of left foot   . Recurrent sinusitis   . H/O colonoscopy   . Family history of malignant neoplasm of colon   . Tendinitis of wrist   . Osteoarthrosis   . Carpal tunnel syndrome   . Allergic rhinitis   . Anaclitic depression   . Fatigue, unspecified type   . Food intolerance   . Weight gain   . Mild intermittent asthma with exacerbation   . Major depressive disorder, recurrent episode, mild (CMS-HCC)   . Trigger finger of right hand   . Heart palpitations   . Inflamed seborrheic keratosis   . Back spasm   . Neck muscle spasm   . Pain of left calf   . Viral syndrome   . Hypertension, unspecified type   . Dizziness   . Encounter for Medicare annual wellness exam   . Hyperlipidemia, unspecified hyperlipidemia type   . Snoring   . Mid back pain on right side   . Neck pain   . Numerous skin moles   . Encounter for  screening mammogram for malignant neoplasm of breast   . Postmenopausal   . Encounter for herb and vitamin supplement management   . Skin cancer screening   . Overweight with body mass index (BMI) of 28 to 28.9 in adult   . Annual physical exam         PAST MEDICAL HISTORY:  Past Medical History:   Diagnosis Date   . Anxiety    . Chest congestion 01/13/2017   . Major depressive disorder, single episode    . Recurrent sinusitis 01/13/2017   . Sinus pressure 12/20/2016       PAST SURGICAL HISTORY:  Past Surgical History:   Procedure Laterality Date   . SINUS SURGERY  04/17/2017   . COLONOSCOPY  11/24/2016    repeat in 5 years   . ANTERIOR CRUCIATE LIGAMENT REPAIR Left    . CARPAL TUNNEL RELEASE Bilateral    . CESAREAN SECTION, CLASSIC  1984, 1986   . TONSILLECTOMY AND ADENOIDECTOMY          FAMILY HISTORY:   Family History   Problem Relation Name Age of Onset   . Cancer Mother          Lymphoma   . Hypertension Mother     . Cancer Father          Colon cancer, tumer to neck carcoma   . Hypertension Father     . Cholesterol/Lipid Disorder Father     . Hypertension Brother           SOCIAL HISTORY:  Social History     Tobacco Use   Smoking Status Never Smoker   Smokeless Tobacco Never Used      Social History     Substance and Sexual Activity   Alcohol Use Yes    Comment: occ      Social History     Substance and Sexual Activity   Drug Use No        Immunization History   Administered Date(s) Administered   . (Shingles) Herpes Zoster Vaccine (ZOSTAVAX) 11/10/2011   . Influenza Vaccine (Unspecified) 10/31/1996, 11/30/2007, 11/06/2009, 09/29/2010, 11/19/2013   . Influenza Vaccine >=6 Months 10/23/2008, 12/19/2011, 12/10/2012, 10/10/2016   . Pneumococcal 23 Vaccine (PNEUMOVAX-23) 09/29/2010   . Tdap 08/31/2009  CURRENT  MEDICATIONS:  Current Outpatient Medications on File Prior to Visit   Medication Sig Dispense Refill   . albuterol 108 (90 Base) MCG/ACT inhaler Inhale 2 puffs by mouth every 6 hours as needed for  Wheezing. 3 each 1   . ALPRAZolam (XANAX) 0.25 MG tablet Take 1 tablet (0.25 mg) by mouth nightly as needed for Anxiety. 5 tablet 0   . fluticasone propionate (FLONASE) 50 MCG/ACT nasal spray Spray 1 spray into each nostril daily.       No current facility-administered medications on file prior to visit.     Outpatient Medications Marked as Taking for the 06/03/20 encounter (Office Visit) with Kandis Cocking, MD   Medication Sig Dispense Refill   . albuterol 108 (90 Base) MCG/ACT inhaler Inhale 2 puffs by mouth every 6 hours as needed for Wheezing. 3 each 1   . ALPRAZolam (XANAX) 0.25 MG tablet Take 1 tablet (0.25 mg) by mouth nightly as needed for Anxiety. 5 tablet 0   . fluticasone propionate (FLONASE) 50 MCG/ACT nasal spray Spray 1 spray into each nostril daily.     . [DISCONTINUED] lisinopril (PRINIVIL, ZESTRIL) 5 MG tablet TAKE 1 TABLET(5 MG) BY MOUTH DAILY 90 tablet 0        ALLERGIES:    Allergies   Allergen Reactions   . Genecof-Xp Hives   . Penicillin G Rash   . Penicillins Rash   . Dilaudid  [Hydromorphone Hcl] Itching   . Hydrocodone Itching   . Venlafaxine Unspecified     Felt weird          REVIEW OF SYSTEMS:  Review of Systems   Constitutional: Positive for fatigue. Negative for chills and fever.   HENT:        Dry mouth   Respiratory: Negative for shortness of breath.         Snoring   Cardiovascular: Negative for chest pain, palpitations and leg swelling.   Gastrointestinal: Negative for abdominal pain, diarrhea, nausea and vomiting.   Musculoskeletal: Positive for back pain and neck pain.   Skin: Negative for rash and wound.        moles   Psychiatric/Behavioral: Negative for dysphoric mood, self-injury and suicidal ideas. The patient is nervous/anxious.            PHYSICAL EXAM:   06/03/20  1438   BP: 142/82   Pulse: 80   Resp: 16     Body mass index is 28.87 kg/m.    Ht Readings from Last 1 Encounters:   06/03/20 5\' 4"  (1.626 m)     Wt Readings from Last 1 Encounters:   06/03/20 76.3 kg (168 lb  3.2 oz)           Physical Exam  Vitals and nursing note reviewed.   Constitutional:       General: She is not in acute distress.     Appearance: She is well-developed. She is not ill-appearing, toxic-appearing or diaphoretic.   HENT:      Head: Normocephalic and atraumatic.      Right Ear: Tympanic membrane, ear canal and external ear normal. There is no impacted cerumen.      Left Ear: Tympanic membrane, ear canal and external ear normal. There is no impacted cerumen.   Cardiovascular:      Rate and Rhythm: Normal rate and regular rhythm.      Heart sounds: Normal heart sounds, S1 normal and S2 normal. No murmur heard.    No  friction rub. No gallop.   Pulmonary:      Effort: Pulmonary effort is normal. No respiratory distress.      Breath sounds: Normal breath sounds. No stridor. No wheezing, rhonchi or rales.   Chest:      Chest wall: No tenderness.   Abdominal:      General: Bowel sounds are normal.   Musculoskeletal:      Right lower leg: No edema.      Left lower leg: No edema.   Skin:     General: Skin is warm.   Neurological:      Mental Status: She is alert.   Psychiatric:         Mood and Affect: Mood normal.         Behavior: Behavior normal.             ASSESSMENT & PLAN:    Desirea was seen today for physical.    Diagnoses and all orders for this visit:    Annual physical exam  Assessment & Plan:  Labs ordered.  Vaccine Recommendations discussed and reviewed.  Diet and exercise discussed in detail with patient.  Routine dental and vision exams advised.  Quality Management Section reviewed and updated.  Family, Surgical History and Social History reviewed.  Screening exams discussed and advised if indicated.        Hyperlipidemia, unspecified hyperlipidemia type  Assessment & Plan:  Stable  Check labs  Continue to monitor     Orders:  -     CBC w/ Diff Lavender  -     CMP  -     Lipid Panel Green Plasma Separator Tube  -     Thyroid Cascade Green Plasma Separator Tube    Snoring  Assessment &  Plan:  New  With daytime fatigue and dry mouth  Suspect undiagnosed sleep apnea   Referral to sleep medicine placed for further evaluation and treatment, possible home sleep study to confirm vs rule out underlying sleep apnea  Continue to monitor       Orders:  -     Consult/Referral to Sleep Medicine [facility]    Overweight with body mass index (BMI) of 28 to 28.9 in adult  Assessment & Plan:  Improving  BMI 28.87  Patient counseled weight loss of 1-1.5lbs per week is considered safe limit  Patient reassured  8 lb weight loss is significant and does not warrant changing dietary regimen  Can continue optavia diet as directed as patient is seeing improvement  Counseled on clinical utility of weight and strength training in exercise regimen to facilitate weight loss  Advised regular exercise regimen of 30-45 min/day as tolerable to maintain weight  Continue to monitor       Mid back pain on right side  Assessment & Plan:  Not improving  Advised establishing with alternate physical therapy clinic for second opinion, recommendations provided  Referral to physical therapy placed for further evaluation and treatment  Advised over the counter tylenol for pain, avoid NSAID use  Advised ice therapy and heat therapy  Advised stretching, massaging affected area as directed  Limit bedrest, advised exercise as tolerable  Continue to monitor  RTC for new or worsening pain, swelling, numbness, tingling     Orders:  -     Refer to Physical Therapy-Outside    Neck pain  Assessment & Plan:  Not improving  Advised establishing with alternate physical therapy clinic for second opinion, recommendations provided  Referral to  physical therapy placed for further evaluation and treatment  Advised over the counter tylenol for pain, avoid NSAID use  Advised ice therapy and heat therapy  Advised stretching, massaging affected area as directed  Limit bedrest, advised exercise as tolerable  Continue to monitor  RTC for new or worsening pain,  swelling, numbness, tingling     Orders:  -     Refer to Physical Therapy-Outside    Numerous skin moles  Assessment & Plan:  Stable  Referral to dermatology placed for full skin check to rule out underlying malignancy  Continue to monitor     Orders:  -     Consult/Referral to Dermatology Clinic    Pain of left calf  Assessment & Plan:  Stable  No concerns for DVT on ultrasound venous duplex 10/09/19  Discussed possible etiologies including magnesium deficiency  Will check magnesium on labs to assess for magnesium deficiency component  Can start over the counter magnesium oxide or chelated magnesium 400-500mg  supplementation as directed to improve cramping  Avoid magnesium citrate due to risk for diarrhea  Continue to monitor      Orders:  -     Magnesium, Blood    Anxiety  Assessment & Plan:  Worsening - no SIHI  Intermittent anxious mood without specific trigger suggestive of GAD  Discussed utility of counseling to learn techniques for management of anxious mood, patient defers at this time  Okay to take alprazolam as directed PRN for sudden severe anxiety, continue to limit use as often as possible to prevent developing dependence  Okay to continue essential oils if patient is seeing improvement  Continue breathing techniques as patient sees improvement  Continue to monitor  Call office if patient develops any symptoms outside of what we are trying to achieve  RTC or Go to ER if patient develops worsening dysphoric mood, anxiety or panic, thoughts of self harm, or SIHI       Encounter for screening mammogram for malignant neoplasm of breast  Assessment & Plan:  Stable  Mammogram ordered  Continue to monitor     Orders:  -     Mammogram, Screening    Postmenopausal  Assessment & Plan:  Stable  DEXA ordered, will call with results  Continue to monitor      Orders:  -     X-RAY Dexa (Bone Density) Skeletal    Encounter for herb and vitamin supplement management  Assessment & Plan:  Stable  Okay to start over the  counter vitamin D and vitamin B12 supplementation at patient's discretion  Continue to monitor       Skin cancer screening  Assessment & Plan:  Stable  Referral to dermatology placed for full skin check to rule out underlying malignancy  Continue to monitor     Orders:  -     Consult/Referral to Dermatology Clinic    Other orders  -     Cholesterol, Total Blood  -     HDL Cholesterol  -     Triglycerides, Blood  -     LDL Cholesterol, Direct  -     Cholesterol/HDLC Ratio-[facility]  -     Non HDL Cholesterol -[facility]          ICD-10-CM ICD-9-CM    1. Annual physical exam  Z00.00 V70.0    2. Hyperlipidemia, unspecified hyperlipidemia type  E78.5 272.4 CBC w/ Diff Lavender      CMP      Lipid Panel Green Plasma  Separator Tube      Thyroid Cascade Green Plasma Separator Tube   3. Snoring  R06.83 786.09 Consult/Referral to Sleep Medicine [facility]   4. Overweight with body mass index (BMI) of 28 to 28.9 in adult  E66.3 278.02     Z68.28 V85.24    5. Mid back pain on right side  M54.9 724.5 Refer to Physical Therapy-Outside   6. Neck pain  M54.2 723.1 Refer to Physical Therapy-Outside   7. Numerous skin moles  D22.9 216.9 Consult/Referral to Dermatology Clinic   8. Pain of left calf  M79.662 729.5 Magnesium, Blood   9. Anxiety  F41.9 300.00    10. Encounter for screening mammogram for malignant neoplasm of breast  Z12.31 V76.12 Mammogram, Screening   11. Postmenopausal  Z78.0 V49.81 X-RAY Dexa (Bone Density) Skeletal   12. Encounter for herb and vitamin supplement management  Z71.89 V65.49    13. Skin cancer screening  Z12.83 V76.43 Consult/Referral to Dermatology Clinic           By signing below, I acknowledge that I have reviewed the above note,  dictated by me and scribed by Zachery Dauer, for accuracy and edited  where necessary. The note accurately reflects the services provided at this  encounter. We retain the right to modify this information in the event of  errors. Occasional errors in punctuation, grammar and content  may occur.      Encounter reviewed and electronically signed by Wayland Salinas, MD.     [facility]  [email or web address]

## 2020-06-03 NOTE — Assessment & Plan Note (Signed)
Stable  Mammogram ordered  Continue to monitor

## 2020-06-03 NOTE — Assessment & Plan Note (Signed)
 Stable  No concerns for DVT on ultrasound venous duplex 10/09/19  Discussed possible etiologies including magnesium deficiency  Will check magnesium on labs to assess for magnesium deficiency component  Can start over the counter magnesium oxide or chelated magnesium 400-500mg  supplementation as directed to improve cramping  Avoid magnesium citrate due to risk for diarrhea  Continue to monitor

## 2020-06-05 LAB — CBC WITH DIFF, BLOOD
Abs Basophils: 49 cells/uL (ref 0–200)
Abs Eosinophils: 92 cells/uL (ref 15–500)
Abs Lymphs: 2009 cells/uL (ref 850–3900)
Abs Monocytes: 340 cells/uL (ref 200–950)
Abs Neutrophils: 2911 cells/uL (ref 1500–7800)
Basophils: 0.9 %
Eosinophils: 1.7 %
HCT: 43.6 % (ref 35.0–45.0)
HGB: 15.2 g/dL (ref 11.7–15.5)
Lymps: 37.2 %
MCH: 29.1 pg (ref 27.0–33.0)
MCHC: 34.9 g/dL (ref 32.0–36.0)
MCV: 83.5 fL (ref 80.0–100.0)
MPV: 11.6 fL (ref 7.5–12.5)
Monocytes: 6.3 %
PLT: 241 10*3/uL (ref 140–400)
RBC: 5.22 10*6/uL — ABNORMAL HIGH (ref 3.80–5.10)
RDW: 12.8 % (ref 11.0–15.0)
SEGS: 53.9 %
WBC: 5.4 10*3/uL (ref 3.8–10.8)

## 2020-06-05 LAB — COMPREHENSIVE METABOLIC PANEL, BLOOD
ALT (SGPT): 17 U/L (ref 6–29)
AST (SGOT): 19 U/L (ref 10–35)
Albumin/Glob Ratio: 1.9 (calc) (ref 1.0–2.5)
Albumin: 4.6 g/dL (ref 3.6–5.1)
Alkaline Phos: 80 U/L (ref 37–153)
BUN: 15 mg/dL (ref 7–25)
Bilirubin, Total: 0.7 mg/dL (ref 0.2–1.2)
Calcium: 10 mg/dL (ref 8.6–10.4)
Carbon Dioxide: 30 mmol/L (ref 20–32)
Chloride: 104 mmol/L (ref 98–110)
Creatinine: 0.76 mg/dL (ref 0.50–0.99)
Globulin: 2.4 g/dL (calc) (ref 1.9–3.7)
Glucose: 88 mg/dL (ref 65–99)
Potassium: 4.2 mmol/L (ref 3.5–5.3)
Sodium: 141 mmol/L (ref 135–146)
Total Protein: 7 g/dL (ref 6.1–8.1)
eGFR African American: 95 mL/min/{1.73_m2} (ref >=60)
eGFR non-Afr.American: 82 mL/min/{1.73_m2} (ref >=60)

## 2020-06-05 LAB — NON HDL CHOLESTEROL -QUEST: Non-HDL Cholesterol: 120 mg/dL (calc) (ref <130)

## 2020-06-05 LAB — TRIGLYCERIDES, BLOOD: Triglycerides: 102 mg/dL (ref <150)

## 2020-06-05 LAB — CHOLESTEROL, TOTAL BLOOD: Cholesterol: 182 mg/dL (ref <200)

## 2020-06-05 LAB — THYROID CASCADE: TSH: 2.58 mIU/L (ref 0.40–4.50)

## 2020-06-05 LAB — HDL-CHOLESTEROL, BLOOD: HDL Cholesterol: 62 mg/dL (ref >=50)

## 2020-06-05 LAB — MAGNESIUM, BLOOD: Magnesium: 2.4 mg/dL (ref 1.5–2.5)

## 2020-06-05 LAB — LDL CHOLESTEROL, DIRECT: LDL-Cholesterol: 100 mg/dL (calc) — ABNORMAL HIGH

## 2020-06-05 LAB — CHOLESTEROL/HDLC RATIO-QUEST: Chol/HDLC Ratio: 2.9 (calc) (ref <5.0)

## 2020-06-09 NOTE — Result Encounter Note (Signed)
 Please inform the patient:    Your lab tests have been reviewed in detail and are overall stable. There are no concerning findings. Continue taking your current medications and supplements. If you have any additional questions or concerns, please schedule a follow up appointment with our clinic for further evaluation and treatment as appropriate.        Many thanks.  Elly Modena MD

## 2020-06-10 ENCOUNTER — Other Ambulatory Visit (INDEPENDENT_AMBULATORY_CARE_PROVIDER_SITE_OTHER): Payer: Self-pay | Admitting: Family Practice

## 2020-06-10 DIAGNOSIS — I10 Essential (primary) hypertension: Secondary | ICD-10-CM

## 2020-06-10 MED ORDER — LISINOPRIL 5 MG OR TABS
5.0000 mg | ORAL_TABLET | Freq: Every day | ORAL | 2 refills | Status: DC
Start: 2020-06-10 — End: 2021-06-27

## 2020-06-17 NOTE — Result Encounter Note (Signed)
 Please inform the patient:    Your recent DEXA scan showed that you have OSTEOPENIA. This is a "pre-osteoporosis" state.       Daily weight bearing exercises are crucial to building strong bones. Further, it is recommended that 500-1,000 mg of calcium and 500 mg of magnesium be taken daily in addition to Vitamin D ( 1,000 to 5,000 IU daily may be needed depending on your vitamin D blood level). Achieving vitamin D blood level above 32 ng/ml is crucial to optimize gut absorption of calcium and magnesium.       Limit alcohol intake to 1 drink per day, stop smoking (if applicable) and consume a diet high in fruits and vegetables to strengthen your bones.       Many thanks.  Elly Modena MD

## 2020-06-18 ENCOUNTER — Encounter (INDEPENDENT_AMBULATORY_CARE_PROVIDER_SITE_OTHER): Payer: Self-pay

## 2020-06-18 ENCOUNTER — Ambulatory Visit (INDEPENDENT_AMBULATORY_CARE_PROVIDER_SITE_OTHER): Payer: Self-pay | Admitting: Physician Assistant

## 2020-06-28 NOTE — Assessment & Plan Note (Signed)
 Labs ordered.  Vaccine Recommendations discussed and reviewed.  Diet and exercise discussed in detail with patient.  Routine dental and vision exams advised.  Quality Management Section reviewed and updated.  Family, Surgical History and Social History reviewed.  Screening exams discussed and advised if indicated.

## 2020-08-15 ENCOUNTER — Encounter (INDEPENDENT_AMBULATORY_CARE_PROVIDER_SITE_OTHER): Payer: Self-pay

## 2020-08-31 ENCOUNTER — Encounter (INDEPENDENT_AMBULATORY_CARE_PROVIDER_SITE_OTHER): Payer: Self-pay | Admitting: General Practice

## 2020-08-31 ENCOUNTER — Telehealth (INDEPENDENT_AMBULATORY_CARE_PROVIDER_SITE_OTHER): Admitting: General Practice

## 2020-08-31 ENCOUNTER — Other Ambulatory Visit: Payer: Self-pay

## 2020-08-31 VITALS — Ht 64.0 in | Wt 154.0 lb

## 2020-08-31 MED ORDER — NITROFURANTOIN MONOHYD MACRO 100 MG OR CAPS
100.0000 mg | ORAL_CAPSULE | Freq: Two times a day (BID) | ORAL | 0 refills | Status: DC
Start: 2020-08-31 — End: 2020-12-08

## 2020-08-31 MED ORDER — TIZANIDINE HCL 2 MG OR TABS: ORAL_TABLET | ORAL | Status: AC

## 2020-09-16 ENCOUNTER — Encounter (INDEPENDENT_AMBULATORY_CARE_PROVIDER_SITE_OTHER): Payer: Self-pay | Admitting: Family Medicine

## 2020-10-01 ENCOUNTER — Encounter (INDEPENDENT_AMBULATORY_CARE_PROVIDER_SITE_OTHER): Payer: Self-pay | Admitting: Family Medicine

## 2020-10-09 ENCOUNTER — Encounter (INDEPENDENT_AMBULATORY_CARE_PROVIDER_SITE_OTHER): Payer: Self-pay | Admitting: Family Medicine

## 2020-12-08 ENCOUNTER — Encounter (INDEPENDENT_AMBULATORY_CARE_PROVIDER_SITE_OTHER): Payer: Self-pay | Admitting: General Practice

## 2020-12-08 ENCOUNTER — Telehealth (INDEPENDENT_AMBULATORY_CARE_PROVIDER_SITE_OTHER): Admitting: General Practice

## 2020-12-08 VITALS — BP 122/83 | HR 95 | Temp 99.8°F | Ht 64.0 in | Wt 150.0 lb

## 2020-12-08 MED ORDER — AZITHROMYCIN 250 MG OR TABS
ORAL_TABLET | ORAL | 0 refills | Status: AC
Start: 2020-12-08 — End: 2020-12-13

## 2020-12-14 ENCOUNTER — Telehealth (INDEPENDENT_AMBULATORY_CARE_PROVIDER_SITE_OTHER): Admitting: Student in an Organized Health Care Education/Training Program

## 2020-12-14 ENCOUNTER — Encounter (INDEPENDENT_AMBULATORY_CARE_PROVIDER_SITE_OTHER): Payer: Self-pay | Admitting: Student in an Organized Health Care Education/Training Program

## 2020-12-14 ENCOUNTER — Other Ambulatory Visit: Payer: Self-pay

## 2020-12-14 VITALS — BP 138/84 | HR 98 | Temp 99.6°F | Ht 64.0 in | Wt 150.0 lb

## 2020-12-14 MED ORDER — METHYLPREDNISOLONE 4 MG OR KIT
ORAL_TABLET | ORAL | 0 refills | Status: DC
Start: 2020-12-14 — End: 2021-04-13

## 2021-04-13 ENCOUNTER — Other Ambulatory Visit: Payer: Self-pay

## 2021-04-13 ENCOUNTER — Telehealth (INDEPENDENT_AMBULATORY_CARE_PROVIDER_SITE_OTHER): Admitting: Physician Assistant

## 2021-04-13 VITALS — Ht 64.0 in | Wt 155.0 lb

## 2021-04-13 MED ORDER — AZITHROMYCIN 250 MG OR TABS
250.0000 mg | ORAL_TABLET | ORAL | 0 refills | Status: DC
Start: 2021-04-13 — End: 2021-04-20

## 2021-04-19 ENCOUNTER — Other Ambulatory Visit: Payer: Self-pay

## 2021-04-20 ENCOUNTER — Ambulatory Visit (INDEPENDENT_AMBULATORY_CARE_PROVIDER_SITE_OTHER): Admitting: Family Medicine

## 2021-04-20 ENCOUNTER — Encounter (INDEPENDENT_AMBULATORY_CARE_PROVIDER_SITE_OTHER): Payer: Self-pay | Admitting: Family Medicine

## 2021-04-20 DIAGNOSIS — J069 Acute upper respiratory infection, unspecified: Secondary | ICD-10-CM

## 2021-04-20 MED ORDER — METHYLPREDNISOLONE 4 MG OR KIT
ORAL_TABLET | ORAL | 0 refills | Status: DC
Start: 2021-04-20 — End: 2021-08-16

## 2021-04-20 MED ORDER — SULFAMETHOXAZOLE-TRIMETHOPRIM 800-160 MG OR TABS
1.0000 | ORAL_TABLET | Freq: Two times a day (BID) | ORAL | 0 refills | Status: AC
Start: 2021-04-20 — End: 2021-04-30

## 2021-04-22 ENCOUNTER — Other Ambulatory Visit (INDEPENDENT_AMBULATORY_CARE_PROVIDER_SITE_OTHER): Payer: Self-pay | Admitting: Family Medicine

## 2021-04-22 DIAGNOSIS — J4521 Mild intermittent asthma with (acute) exacerbation: Secondary | ICD-10-CM

## 2021-04-23 MED ORDER — ALBUTEROL SULFATE 108 (90 BASE) MCG/ACT IN AERS
2.0000 | INHALATION_SPRAY | Freq: Four times a day (QID) | RESPIRATORY_TRACT | 1 refills | Status: DC | PRN
Start: 2021-04-23 — End: 2021-08-16

## 2021-05-07 ENCOUNTER — Ambulatory Visit (INDEPENDENT_AMBULATORY_CARE_PROVIDER_SITE_OTHER): Admitting: Medical

## 2021-05-07 ENCOUNTER — Encounter (INDEPENDENT_AMBULATORY_CARE_PROVIDER_SITE_OTHER): Payer: Self-pay | Admitting: Medical

## 2021-05-07 ENCOUNTER — Telehealth (INDEPENDENT_AMBULATORY_CARE_PROVIDER_SITE_OTHER): Payer: Self-pay | Admitting: Medical

## 2021-05-07 VITALS — BP 122/76 | HR 60 | Temp 98.8°F | Resp 16 | Ht 64.0 in | Wt 158.0 lb

## 2021-05-07 DIAGNOSIS — R059 Cough, unspecified: Secondary | ICD-10-CM

## 2021-05-07 DIAGNOSIS — J4521 Mild intermittent asthma with (acute) exacerbation: Secondary | ICD-10-CM

## 2021-05-07 DIAGNOSIS — J45909 Unspecified asthma, uncomplicated: Secondary | ICD-10-CM | POA: Insufficient documentation

## 2021-05-07 DIAGNOSIS — J309 Allergic rhinitis, unspecified: Secondary | ICD-10-CM

## 2021-05-07 HISTORY — DX: Unspecified asthma, uncomplicated: J45.909

## 2021-05-07 MED ORDER — CETIRIZINE HCL 10 MG OR TABS
10.0000 mg | ORAL_TABLET | Freq: Every day | ORAL | 1 refills | Status: AC
Start: 2021-05-07 — End: ?

## 2021-05-07 MED ORDER — FLUTICASONE-SALMETEROL 100-50 MCG/ACT IN AEPB
1.0000 | INHALATION_SPRAY | Freq: Two times a day (BID) | RESPIRATORY_TRACT | 1 refills | Status: AC
Start: 2021-05-07 — End: ?

## 2021-05-07 NOTE — Assessment & Plan Note (Addendum)
Active   Start Zyrtec 10 mg  F/u if sxs persist or worsen  Monitor

## 2021-05-07 NOTE — Telephone Encounter (Signed)
Pt called following up on cough medicine.     Per patient she went to the pharmacy and they only had two prescriptions ready but no cough medicine.     Can you please advise if patient is to take OTC cough medicine and if yes which one do you recommend she takes?     Please advise.

## 2021-05-07 NOTE — Assessment & Plan Note (Addendum)
Active  Start Advair 100-50 MCG/ACT inhaler  F/u if sxs persist or worsen  Monitor

## 2021-05-07 NOTE — Telephone Encounter (Signed)
Cough medication has been sent

## 2021-05-07 NOTE — Progress Notes (Signed)
Encounter Date:  05/07/21  8:23 AM   PCP: Adrienne MochaMadrid, R. Eric  MRN: [id number]  DOB: 09/21/1955        HPI:  Debbie Hudson is a 66 year old female presents   Chief Complaint   Patient presents with   . Sickness     Pt states x 1 month she has been having a cough that gets worse at night, also feels like she is wheezing. Bilateral ears feel plugged.   Pt is not taking any OTC meds for her sx.      Coughing/ Wheezing/ SOB  Pt c/o coughing, wheezing, dry throat, ear pressure, and SOB x 4 weeks  Pt was treated first with z-pak, then with bactrim and medrol dose pack  Pt took Mucinex 05/05/20 which helped with pressure in her ears, but the next morning experienced near syncope. Pt checked blood pressure which was 116/64  Pt had fever and sore throat which have since resolved  Pt states medrol dose pack helped with ear pressure  Pt did allergy injections for 6 years and stopped in 2013  Pt states she started wheezing again after getting covid  Pt takes albuterol 108 (90 Base) MCG/ACT inhaler  Sx overall all have improved except for wheezing and cough at night    PROBLEM  LIST:  Patient Active Problem List   Diagnosis   . Anxiety   . Herpes zoster without complication   . Mass of left foot   . Recurrent sinusitis   . H/O colonoscopy   . Family history of malignant neoplasm of colon   . Tendinitis of wrist   . Osteoarthrosis   . Carpal tunnel syndrome   . Allergic rhinitis   . Anaclitic depression   . Fatigue, unspecified type   . Food intolerance   . Weight gain   . Mild intermittent asthma with exacerbation   . Major depressive disorder, recurrent episode, mild (CMS-HCC)   . Trigger finger of right hand   . Heart palpitations   . Inflamed seborrheic keratosis   . Back spasm   . Neck muscle spasm   . Pain of left calf   . Viral syndrome   . Hypertension, unspecified type   . Dizziness   . Encounter for Medicare annual wellness exam   . Hyperlipidemia, unspecified hyperlipidemia type   . Snoring   . Mid back pain on right side   .  Neck pain   . Numerous skin moles   . Encounter for screening mammogram for malignant neoplasm of breast   . Postmenopausal   . Encounter for herb and vitamin supplement management   . Skin cancer screening   . Overweight with body mass index (BMI) of 28 to 28.9 in adult   . Annual physical exam   . Upper respiratory tract infection, unspecified type   . Subacute cough         PAST MEDICAL HISTORY:  Past Medical History:   Diagnosis Date   . Anxiety    . Asthma 05/07/2021   . Chest congestion 01/13/2017   . Hypertension 04/07/2020   . Major depressive disorder, single episode    . Recurrent sinusitis 01/13/2017   . Sinus pressure 12/20/2016       PAST SURGICAL HISTORY:  Past Surgical History:   Procedure Laterality Date   . SINUS SURGERY  04/17/2017   . COLONOSCOPY  11/24/2016    repeat in 5 years   . ADENOIDECTOMY  1962   . ANTERIOR  CRUCIATE LIGAMENT REPAIR Left    . CARPAL TUNNEL RELEASE Bilateral    . CESAREAN SECTION, CLASSIC  1984, 1986   . TONSILLECTOMY AND ADENOIDECTOMY          FAMILY HISTORY:   Family History   Problem Relation Name Age of Onset   . Cancer Mother          Lymphoma   . Hypertension Mother     . Cancer Father          Colon cancer, tumer to neck carcoma   . Hypertension Father     . Cholesterol/Lipid Disorder Father     . Hypertension Brother           SOCIAL HISTORY:  Social History     Socioeconomic History   . Marital status: Married     Spouse name: Not on file   . Number of children: 2   . Years of education: 2   . Highest education level: Not on file   Occupational History   . Occupation: Retired / Optometrist   Tobacco Use   . Smoking status: Never   . Smokeless tobacco: Never   Vaping Use   . Vaping status: Not on file   Substance and Sexual Activity   . Alcohol use: Yes     Comment: occ   . Drug use: No   . Sexual activity: Not on file   Other Topics Concern   . Not on file   Social History Narrative   . Not on file     Social Determinants of Health     Financial Resource Strain:  Not on file   Food Insecurity: Not on file   Transportation Needs: Not on file   Physical Activity: Not on file   Stress: Not on file   Social Connections: Not on file   Intimate Partner Violence: Not on file   Housing Stability: Not on file     Social History     Tobacco Use   Smoking Status Never   Smokeless Tobacco Never   Vaping Use   Vaping Status Not on file      Social History     Substance and Sexual Activity   Alcohol Use Yes    Comment: occ      Social History     Substance and Sexual Activity   Drug Use No        Immunization History   Administered Date(s) Administered   . (Shingles) Herpes Zoster Vaccine (ZOSTAVAX) 11/10/2011   . Influenza Vaccine (Unspecified) 10/31/1996, 11/30/2007, 11/06/2009, 09/29/2010, 11/19/2013   . Influenza Vaccine >=6 Months 10/23/2008, 12/19/2011, 12/10/2012, 10/10/2016   . Pneumococcal 23 Vaccine (PNEUMOVAX-23) 09/29/2010   . Tdap 08/31/2009         CURRENT  MEDICATIONS:  Current Outpatient Medications on File Prior to Visit   Medication Sig Dispense Refill   . albuterol 108 (90 Base) MCG/ACT inhaler Inhale 2 puffs by mouth every 6 hours as needed for Wheezing. 3 each 1   . ALPRAZolam (XANAX) 0.25 MG tablet Take 1 tablet (0.25 mg) by mouth nightly as needed for Anxiety. 5 tablet 0   . fluticasone propionate (FLONASE) 50 MCG/ACT nasal spray Spray 1 spray into each nostril daily.     Marland Kitchen lisinopril (PRINIVIL, ZESTRIL) 5 MG tablet Take 1 tablet (5 mg) by mouth daily. 90 tablet 2   . methylPREDNISolone (MEDROL DOSEPACK) 4 MG tablet Take as directed on package 1 each 0   .  tizanidine (ZANAFLEX) 2 MG tablet tizanidine 2 mg tablet   PRN       No current facility-administered medications on file prior to visit.     Outpatient Medications Marked as Taking for the 05/07/21 encounter (Office Visit) with Elon Alas, PA   Medication Sig Dispense Refill   . albuterol 108 (90 Base) MCG/ACT inhaler Inhale 2 puffs by mouth every 6 hours as needed for Wheezing. 3 each 1   . ALPRAZolam  (XANAX) 0.25 MG tablet Take 1 tablet (0.25 mg) by mouth nightly as needed for Anxiety. 5 tablet 0   . fluticasone propionate (FLONASE) 50 MCG/ACT nasal spray Spray 1 spray into each nostril daily.     . methylPREDNISolone (MEDROL DOSEPACK) 4 MG tablet Take as directed on package 1 each 0   . tizanidine (ZANAFLEX) 2 MG tablet tizanidine 2 mg tablet   PRN          ALLERGIES:    Allergies   Allergen Reactions   . Genecof-Xp Hives   . Penicillin G Rash   . Penicillins Rash   . Dilaudid  [Hydromorphone Hcl] Itching   . Hydrocodone Itching   . Venlafaxine Unspecified     Felt weird          REVIEW OF SYSTEMS:  All other systems reviewed and are negative.       PHYSICAL EXAM:   05/07/21  0938   BP: 122/76   Pulse: 60   Resp: 16   Temp: 98.8 F (37.1 C)   SpO2: 96%     Body mass index is 27.12 kg/m.    Ht Readings from Last 1 Encounters:   05/07/21  (1.626 m)     Wt Readings from Last 1 Encounters:   05/07/21 71.7 kg (158 lb)           Physical Exam  Vitals and nursing note reviewed.   Constitutional:       Appearance: She is well-developed.   Cardiovascular:      Rate and Rhythm: Normal rate and regular rhythm.      Heart sounds: Normal heart sounds.   Pulmonary:      Effort: Pulmonary effort is normal.      Breath sounds: Normal breath sounds. No wheezing.   Skin:     General: Skin is warm and dry.   Psychiatric:         Behavior: Behavior normal.             ASSESSMENT & PLAN:    Alvin was seen today for sickness.    Diagnoses and all orders for this visit:    Allergic rhinitis, unspecified seasonality, unspecified trigger  Assessment & Plan:  Active   Start Zyrtec 10 mg  F/u if sxs persist or worsen  Monitor      Orders:  -     cetirizine (ZYRTEC) 10 MG tablet; Take 1 tablet (10 mg) by mouth daily.    Mild intermittent asthma with exacerbation  Assessment & Plan:  Active  Start Advair 100-50 MCG/ACT inhaler  F/u if sxs persist or worsen  Monitor    Orders:  -     fluticasone-salmeterol (ADVAIR) 100-50 MCG/ACT  inhaler; Inhale 1 puff by mouth every 12 hours.        ICD-10-CM ICD-9-CM    1. Allergic rhinitis, unspecified seasonality, unspecified trigger  J30.9 477.9 cetirizine (ZYRTEC) 10 MG tablet      2. Mild intermittent asthma with exacerbation  J45.21 493.92  fluticasone-salmeterol (ADVAIR) 100-50 MCG/ACT inhaler            By signing below, I acknowledge that I have reviewed the above note, dictated by me and scribed by Particia Lather, for accuracy and edited  where necessary. The note accurately reflects the services provided at this  encounter. We retain the right to modify this information in the event of  errors. Occasional errors in punctuaton, grammar and content may occur.      Electronically reviewed and signed by, Elon Alas, PA          [facility]  [email or web address]

## 2021-05-11 MED ORDER — BENZONATATE 100 MG OR CAPS
100.0000 mg | ORAL_CAPSULE | Freq: Three times a day (TID) | ORAL | 0 refills | Status: DC | PRN
Start: 2021-05-11 — End: 2021-08-16

## 2021-05-11 NOTE — Telephone Encounter (Signed)
From: Hyman Bower  To: Elon Alas, PA  Sent: 05/07/2021 6:51 PM PDT  Subject: Cough medicine     I thought that you were going to order cough medicine for me.I am still coughing

## 2021-06-27 ENCOUNTER — Other Ambulatory Visit (INDEPENDENT_AMBULATORY_CARE_PROVIDER_SITE_OTHER): Payer: Self-pay | Admitting: Family Practice

## 2021-06-27 DIAGNOSIS — I10 Essential (primary) hypertension: Secondary | ICD-10-CM

## 2021-06-28 MED ORDER — LISINOPRIL 5 MG OR TABS
5.0000 mg | ORAL_TABLET | Freq: Every day | ORAL | 2 refills | Status: DC
Start: 2021-06-28 — End: 2022-04-04

## 2021-06-29 ENCOUNTER — Other Ambulatory Visit: Payer: Self-pay

## 2021-06-29 ENCOUNTER — Encounter (INDEPENDENT_AMBULATORY_CARE_PROVIDER_SITE_OTHER): Payer: Self-pay | Admitting: Family Medicine

## 2021-07-06 ENCOUNTER — Encounter (INDEPENDENT_AMBULATORY_CARE_PROVIDER_SITE_OTHER): Payer: Self-pay | Admitting: Family Medicine

## 2021-07-06 ENCOUNTER — Other Ambulatory Visit: Payer: Self-pay

## 2021-07-06 ENCOUNTER — Ambulatory Visit (INDEPENDENT_AMBULATORY_CARE_PROVIDER_SITE_OTHER): Admitting: Family Medicine

## 2021-07-12 NOTE — Assessment & Plan Note (Signed)
Overall Stable  Denies SI/HI  Continue to monitor and take medications as listed above  A healthy diet and routine physical activity also advised for optimal health and depression management  If any sx worsen, or are the opposite of what we are trying to achieve, then call the office asap  Any suicidal ideation, patient instructed to call  Consider counseling/psychiatry   Continue to monitor

## 2021-07-12 NOTE — Assessment & Plan Note (Signed)
   Stable   On lisinopril 5 mg    Take your blood pressure medications as directed   Home blood pressure monitoring advised   Schedule a Follow up appointment as directed   Continue to monitor   Blood Pressure   05/07/21 122/76   04/20/21 142/90   12/14/20 138/84            NOTE TO PATIENTS ON PORTAL:   Invest in a digital  blood pressure monitor so you can measure at home   Keep a journal to monitor results- bring results to your doctor appointments   Exercise for 30  minutes daily and monitor  your diet   Participate in stress reducing activities  (meditation, prayer, listening to music)   A diet high in green  vegetables and low sugar fruits (DASH diet) can help lower blood pressure   Reaching your optimal weight can help lower blood pressure and may reduce  your need for medications   At minium, check blood tests once per year to make sure your liver and kidneys are healthy   Most with high blood pressure should be seen every 4-6 months in the clinic unless told otherwise   Supplements which may help lower blood pressure include magnesium, coenzyme  Q10 and hawthorne berry.   Visit our health education web site at [email or web address]

## 2021-07-12 NOTE — Assessment & Plan Note (Signed)
Stable   A 5-10 year schedule of tests/procedures and also community resources has been provided to the patient at this visit.  Vision test done and documented in EMR.  Check labs as indicated.  Wellness Exam  The patient is low risk for falls and there are no safety concerns that I am aware of.  There is no evidence of cognitive impairment detected based on my interactions with this patient nor based on any of the medical records I have seen. Family members have not brought up concerns to me.  Dietary and exercise habits discussed. I recommended increase intake of fruit and vegetables on a daily basis for optimal health. Exercise as tolerated.  Increase physical activity was encouraged in order to increase level of fitness, strength and reduce long term health problems.  No community based referrals needed at this time.  The patient's past medical hx, family history, social history have been reviewed as documented above.  Patient asked about their Advanced Directive. he/she did not have one, a handout has been provided to patient to fill it out. We have asked them to bring us a copy.  Screening exams completed   Continue to monitor

## 2021-07-12 NOTE — Progress Notes (Unsigned)
[place] - [place]-  [place] - [place] - [place]   [email or web address] and [email or web address]  Telephone: [phone]             Encounter Date:  07/13/2021  5:44 PM   PCP: Adrienne Mocha  MRN: [id number]  DOB: [birth year]     CC: Wellness Visit (65 year old female presents for routine physical./Last CPE 05/31/2020/Dental exam 01/2021/Vision exam 2021/Mammogram 12/01/2020/Colonoscopy 2018/DEXA scan 05/2020/Tdap 2011/P-23 09/2010)       HPI:  Debbie Hudson is a 66 year old female presents   Chief Complaint   Patient presents with   . Wellness Visit     66 year old female presents for routine physical.  Last CPE 05/31/2020  Dental exam 01/2021  Vision exam 2021  Mammogram 12/01/2020  Colonoscopy 2018  DEXA scan 05/2020  Tdap 2011  P-23 09/2010     Annual Wellness Exam:   Last dental exam: 01/2021  Last eye exam: 2021, patient due   Last pap: > 5 years   Hearing: no complaints   Memory: denies noticing memory loss  Falls: denies recent falls in > 6 months    Immunization History   Administered Date(s) Administered   . (Shingles) Herpes Zoster Vaccine (ZOSTAVAX) 11/10/2011   . Influenza Vaccine (Unspecified) 10/31/1996, 11/30/2007, 11/06/2009, 09/29/2010, 11/19/2013   . Influenza Vaccine >=6 Months 10/23/2008, 12/19/2011, 12/10/2012, 10/10/2016   . Pneumococcal 23 Vaccine (PNEUMOVAX-23) 09/29/2010   . Tdap 08/31/2009   Patient counseled      Tobacco Use: never smoker  EtOH use: social drinker   Illicit drug use: patient does not report   Diet: no special diet or restrictions   Exercise: no formal exercise reported, Denies chest pain, SOB or palpitations with exertion.     Last Colon [place] screening: 11/24/16, 5 year follow up - Denies constipation, diarrhea or blood in stool. Father had colon cancer.   Last DEXA: 2022 osteopenia   Last Mammogram: 11/2020 normal, Denies breast lumps, bumps or pains. Performs self breast exams.     FMHx:    Family History   Problem Relation Name Age of Onset   . Cancer Mother Georgiann Mccoy         Lymphoma   . Hypertension Mother Georgiann Mccoy    . Cancer Father Nile Riggs         Colon, Chondrosarcoma   . Hypertension Father Nile Riggs    . Cholesterol/Lipid Disorder Father Nile Riggs    . Hypertension Brother Deeann Cree    . Hypertension Brother Lauretta Chester       SocialHx:   Past Surgical History:   Procedure Laterality Date   . SINUS SURGERY  04/17/2017   . COLONOSCOPY  11/24/2016    repeat in 5 years   . ADENOIDECTOMY  1962   . ANTERIOR CRUCIATE LIGAMENT REPAIR Left    . CARPAL TUNNEL RELEASE Bilateral    . CESAREAN SECTION, CLASSIC  1984, 1986   . TONSILLECTOMY AND ADENOIDECTOMY         Concerns:     Hypertension  Patient with hx of hypertension  She is on lisinopril 5 mg   She reports a couple of episodes where blood pressure has been lower than 110  She inquires on necessity for blood pressure medication   Blood Pressure   07/13/21 124/76   05/07/21 122/76   04/20/21 142/90     Hyperlipidemia   Patient with hyperlipidemia  She is not on statin therapy  She denies chest pain, pressure, and palpitations   Lab Results   Component Value Date    HDL 62 06/04/2020    LDL 100 (H) 06/04/2020    TRIG 102 06/04/2020    CHOL 182 06/04/2020     MDD  Patient has major depressive disorder, recurrent with anxiety   She is on Xanax 0.25 mg for anxiety, has not needed to use since it was filled in 2021  She has been using Calm natural supplements   She has not been treating with antidepressant therapy  She attributes this to early menoapsuse and denies recent mood changes   Denies SI/HI     Back spasms   Patient has complaint of right sided back spasms and pain that have been occurring for a couple weeks, has happened before  She has taken Flexeril as needed    Has done physical therapy in the past with mild improvement     Joint pain  She has been feeling an aching feeling for the last couple of weeks all over her  Denies cold or flu like symptoms     Skin concerns   Patient presents with  skin concerns   Saw derm last year - Dr. Idelle Jo   She had a biopsy on her back and results showed irregular cells, evaluated for [facility], and was recommended to follow up but did not desire to since she did not notice symptoms or return of lesion  She reports a right breast rash appearing in the last week   She also noted a cyst on her back of neck        07/13/2021    10:13 AM 05/07/2021     9:38 AM 04/20/2021     3:24 PM 04/13/2021     2:02 PM 12/14/2020     5:11 PM 12/08/2020     8:32 AM   Date Weight Recorded   Metric 74.844 kg 71.668 kg 70.308 kg 70.308 kg 68.04 kg 68.04 kg   Pounds/Ounces 165 lb 158 lb 155 lb 155 lb 150 lb 150 lb      Asthma   Patient with dx of asthma  Patient has had shortness of breath since she had a cold 04/2021   She was experiencing dizziness and blood pressure was 109/60  She is on Advair, albuterol, and zyrtec for management   Has not needed albuterol     Other chronic conditions, if discussed, are documented below in the A/P      PROBLEM  LIST:  Patient Active Problem List   Diagnosis   . Anxiety   . Herpes zoster without complication   . Mass of left foot   . Recurrent sinusitis   . H/O colonoscopy   . Family history of malignant neoplasm of colon   . Tendinitis of wrist   . Osteoarthrosis   . Carpal tunnel syndrome   . Allergic rhinitis   . Anaclitic depression   . Fatigue, unspecified type   . Food intolerance   . Weight gain   . Mild intermittent asthma with exacerbation   . Major depressive disorder, recurrent episode, mild (CMS-HCC)   . Trigger finger of right hand   . Heart palpitations   . Inflamed seborrheic keratosis   . Back spasm   . Neck muscle spasm   . Pain of left calf   . Viral syndrome   . Hypertension, unspecified type   . Dizziness   . Encounter for Medicare annual wellness exam   .  Hyperlipidemia, unspecified hyperlipidemia type   . Snoring   . Mid back pain on right side   . Neck pain   . Numerous skin moles   . Encounter for screening mammogram for malignant neoplasm of  breast   . Postmenopausal   . Encounter for herb and vitamin supplement management   . Skin cancer screening   . Overweight with body mass index (BMI) of 28 to 28.9 in adult   . Annual physical exam   . Upper respiratory tract infection, unspecified type   . Subacute cough   . Medicare annual wellness visit, subsequent   . Encounter for colorectal cancer screening   . History of basal cell carcinoma (BCC)         PAST MEDICAL HISTORY:  Past Medical History:   Diagnosis Date   . Anxiety    . Asthma 05/07/2021   . Chest congestion 01/13/2017   . Hypertension 04/07/2020   . Major depressive disorder, single episode    . Recurrent sinusitis 01/13/2017   . Sinus pressure 12/20/2016       PAST SURGICAL HISTORY:  Past Surgical History:   Procedure Laterality Date   . SINUS SURGERY  04/17/2017   . COLONOSCOPY  11/24/2016    repeat in 5 years   . ADENOIDECTOMY  1962   . ANTERIOR CRUCIATE LIGAMENT REPAIR Left    . CARPAL TUNNEL RELEASE Bilateral    . CESAREAN SECTION, CLASSIC  1984, 1986   . TONSILLECTOMY AND ADENOIDECTOMY          FAMILY HISTORY:   Family History   Problem Relation Name Age of Onset   . Cancer Mother Georgiann Mccoy         Lymphoma   . Hypertension Mother Georgiann Mccoy    . Cancer Father Nile Riggs         Colon, Chondrosarcoma   . Hypertension Father Nile Riggs    . Cholesterol/Lipid Disorder Father Nile Riggs    . Hypertension Brother Deeann Cree    . Hypertension Brother Lauretta Chester          SOCIAL HISTORY:  Social History     Socioeconomic History   . Marital status: Married   . Number of children: 2   . Years of education: 2   Occupational History   . Occupation: Retired / Optometrist   Tobacco Use   . Smoking status: Never   . Smokeless tobacco: Never   Substance and Sexual Activity   . Alcohol use: Not Currently     Comment: occ   . Drug use: No   . Sexual activity: Not Currently     Partners: Male   Other Topics Concern   . Military Service No   . Blood Transfusions No   . Caffeine  Concern No   . Occupational Exposure No   . Hobby Hazards No   . Sleep Concern Yes     Comment: snoring   . Stress Concern No   . Weight Concern Yes   . Special Diet No   . Back Care Yes     Comment: neck stiffness, rt mid back stiffness   . Exercises Regularly No   . Bike Helmet Use Yes   . Seat Belt Use Yes   . Performs Self-Exams Yes     Social History     Tobacco Use   Smoking Status Never   Smokeless Tobacco Never      Social History  Substance and Sexual Activity   Alcohol Use Not Currently    Comment: occ      Social History     Substance and Sexual Activity   Drug Use No        Immunization History   Administered Date(s) Administered   . (Shingles) Herpes Zoster Vaccine (ZOSTAVAX) 11/10/2011   . Influenza Vaccine (Unspecified) 10/31/1996, 11/30/2007, 11/06/2009, 09/29/2010, 11/19/2013   . Influenza Vaccine >=6 Months 10/23/2008, 12/19/2011, 12/10/2012, 10/10/2016   . Pneumococcal 23 Vaccine (PNEUMOVAX-23) 09/29/2010   . Tdap 08/31/2009         CURRENT  MEDICATIONS:  Current Outpatient Medications on File Prior to Visit   Medication Sig Dispense Refill   . albuterol 108 (90 Base) MCG/ACT inhaler Inhale 2 puffs by mouth every 6 hours as needed for Wheezing. 3 each 1   . ALPRAZolam (XANAX) 0.25 MG tablet Take 1 tablet (0.25 mg) by mouth nightly as needed for Anxiety. 5 tablet 0   . benzonatate (TESSALON) 100 MG capsule Take 1 capsule (100 mg) by mouth every 8 hours as needed for Cough. 30 capsule 0   . cetirizine (ZYRTEC) 10 MG tablet Take 1 tablet (10 mg) by mouth daily. 90 tablet 1   . fluticasone propionate (FLONASE) 50 MCG/ACT nasal spray Spray 1 spray into each nostril daily.     . fluticasone-salmeterol (ADVAIR) 100-50 MCG/ACT inhaler Inhale 1 puff by mouth every 12 hours. 180 each 1   . lisinopril (PRINIVIL, ZESTRIL) 5 MG tablet Take 1 tablet (5 mg) by mouth daily. 90 tablet 2   . methylPREDNISolone (MEDROL DOSEPACK) 4 MG tablet Take as directed on package 1 each 0   . tizanidine (ZANAFLEX) 2 MG  tablet tizanidine 2 mg tablet   PRN       No current facility-administered medications on file prior to visit.     Outpatient Medications Marked as Taking for the 07/13/21 encounter (Office Visit) with Kandis Cocking, MD   Medication Sig Dispense Refill   . albuterol 108 (90 Base) MCG/ACT inhaler Inhale 2 puffs by mouth every 6 hours as needed for Wheezing. 3 each 1   . ALPRAZolam (XANAX) 0.25 MG tablet Take 1 tablet (0.25 mg) by mouth nightly as needed for Anxiety. 5 tablet 0   . cetirizine (ZYRTEC) 10 MG tablet Take 1 tablet (10 mg) by mouth daily. 90 tablet 1   . fluticasone propionate (FLONASE) 50 MCG/ACT nasal spray Spray 1 spray into each nostril daily.     . fluticasone-salmeterol (ADVAIR) 100-50 MCG/ACT inhaler Inhale 1 puff by mouth every 12 hours. 180 each 1   . lisinopril (PRINIVIL, ZESTRIL) 5 MG tablet Take 1 tablet (5 mg) by mouth daily. 90 tablet 2   . tizanidine (ZANAFLEX) 2 MG tablet tizanidine 2 mg tablet   PRN          ALLERGIES:    Allergies   Allergen Reactions   . Genecof-Xp Hives   . Penicillin G Rash   . Penicillins Rash   . Dilaudid  [Hydromorphone Hcl] Itching   . Hydrocodone Itching   . Venlafaxine Unspecified     Felt weird          REVIEW OF SYSTEMS:  Review of Systems  Negative except as noted in HPI         PHYSICAL EXAM:   07/13/21  1013   BP: 124/76   Pulse: 64   Temp: 97.9 F (36.6 C)   Resp: 16  Body mass index is 28.32 kg/m.      Ht Readings from Last 1 Encounters:   07/13/21 5\' 4"  (1.626 m)     Wt Readings from Last 1 Encounters:   07/13/21 74.8 kg (165 lb)         07/13/2021    10:13 AM 05/07/2021     9:38 AM 04/20/2021     3:24 PM 04/13/2021     2:02 PM 12/14/2020     5:11 PM 12/08/2020     8:32 AM   Date Weight Recorded   Metric 74.844 kg 71.668 kg 70.308 kg 70.308 kg 68.04 kg 68.04 kg   Pounds/Ounces 165 lb 158 lb 155 lb 155 lb 150 lb 150 lb            Physical Exam  Vitals and nursing note reviewed.   Constitutional:       Appearance: She is well-developed.   Cardiovascular:       Rate and Rhythm: Normal rate and regular rhythm.      Heart sounds: Normal heart sounds.   Pulmonary:      Effort: Pulmonary effort is normal.      Breath sounds: Normal breath sounds.   Skin:     General: Skin is warm and dry.   Psychiatric:         Behavior: Behavior normal.                  ASSESSMENT & PLAN:    Debbie Hudson was seen today for wellness visit.    Diagnoses and all orders for this visit:    Encounter for Medicare annual wellness exam  Assessment & Plan:  Stable     A 5-10 year schedule of tests/procedures and also community resources has been provided to the patient at this visit.    Vision test done and documented in EMR.    Check labs as indicated.    Wellness Exam  The patient is low risk for falls and there are no safety concerns that I am aware of.    There is no evidence of cognitive impairment detected based on my interactions with this patient nor based on any of the medical records I have seen. Family members have not brought up concerns to me.    Dietary and exercise habits discussed. I recommended increase intake of fruit and vegetables on a daily basis for optimal health. Exercise as tolerated.  Increase physical activity was encouraged in order to increase level of fitness, strength and reduce long term health problems.    No community based referrals needed at this time.    The patient's past medical hx, family history, social history have been reviewed as documented above.    Patient asked about their Advanced Directive. he/she did not have one, a handout has been provided to patient to fill it out. We have asked them to bring us a copy.    Screening exams completed    Continue to monitor          Major depressive disorder, recurrent episode, mild (CMS-HCC)  Assessment & Plan:  Overall Stable  Denies SI/HI  Continue to monitor and take medications as listed above  A healthy diet and routine physical activity also advised for optimal health and depression management  If any sx worsen,  or are the opposite of what we are trying to achieve, then call the office asap  Any suicidal ideation, patient instructed to call  Consider counseling/psychiatry   Continue  to monitor       Encounter for screening mammogram for malignant neoplasm of breast  Assessment & Plan:  Stable  Mammogram ordered  Follow up for results  Patient counseled on importance of routine breast cancer screenings   Advised patient to perform regular self breast exams to monitor for changes   Continue to monitor      Orders:  -     Tomo Digital Screening Mammography - Bilateral    Encounter for colorectal cancer screening  Assessment & Plan:  Stable  GI referral submitted for diagnostic colonoscopy   Follow up for results  Patient was counseled on the importance of colorectal screenings and advised on the difference between routine screenings and colonoscopy procedure  Patient expresses understanding   Continue to monitor       Orders:  -     Consult/Referral to GI for Colonoscopy Screening    Hypertension, unspecified type  Assessment & Plan:   Stable   On lisinopril 5 mg    Take your blood pressure medications as directed   Home blood pressure monitoring advised   Schedule a Follow up appointment as directed   Continue to monitor   Blood Pressure   05/07/21 122/76   04/20/21 142/90   12/14/20 138/84            NOTE TO PATIENTS ON PORTAL:   Invest in a digital  blood pressure monitor so you can measure at home   Keep a journal to monitor results- bring results to your doctor appointments   Exercise for 30  minutes daily and monitor  your diet   Participate in stress reducing activities  (meditation, prayer, listening to music)   A diet high in green  vegetables and low sugar fruits (DASH diet) can help lower blood pressure   Reaching your optimal weight can help lower blood pressure and may reduce  your need for medications   At minium, check blood tests once per year to make sure your liver and kidneys are  healthy   Most with high blood pressure should be seen every 4-6 months in the clinic unless told otherwise   Supplements which may help lower blood pressure include magnesium, coenzyme  Q10 and hawthorne berry.   Visit our health education web site at [email or web address]         Hyperlipidemia, unspecified hyperlipidemia type  Assessment & Plan:  Stable  Advised pt to consume a  low  Sugar / low simple carbohydrate diet and the need to increase plant based fiber (25 grams/day min), vegetables and whole grains to help lower cholesterol  Recommend increasing exercise and cont monitoring diet with goal of losing weight  Labs as ordered, follow up for results  Continue to monitor     Lab Results   Component Value Date    CHOL 182 06/04/2020    HDL 62 06/04/2020    LDL 100 (H) 06/04/2020    TRIG 102 06/04/2020        Orders:  -     CBC w/ Diff Lavender  -     Comprehensive Metabolic Panel  -     Lipid Panel Green Plasma Separator Tube    Anxiety  Assessment & Plan:  Stable  Denies SI/HI  Intermittent anxious mood without specific trigger suggestive of GAD  Discussed utility of counseling to learn techniques for management of anxious mood, patient defers at this time  Okay to take alprazolam as directed PRN for sudden  severe anxiety, continue to limit use as often as possible to prevent developing dependence  Okay to continue essential oils if patient is seeing improvement  Continue breathing techniques as patient sees improvement  Continue to monitor  Call office if patient develops any symptoms outside of what we are trying to achieve  RTC or Go to ER if patient develops worsening dysphoric mood, anxiety or panic, thoughts of self harm, or SIHI       Back spasm  Assessment & Plan:  Active  Referral for physical therapy   Stretch as tolerated   Take Tylenol as needed    Ice and heat compressions   Continue to monitor     Orders:  -     Physical Therapy - Outside (Non-[place])    History of basal cell carcinoma  (BCC)  Assessment & Plan:  Stable  Follow up with dermatology as recommended  Limit sun exposure  Continue to monitor     Orders:  -     Dermatology  Consult [facility]    Skin cancer screening  Assessment & Plan:  Stable  Follow up with dermatology as recommended  Limit sun exposure  Continue to monitor     Orders:  -     Dermatology  Consult [facility]        ICD-10-CM ICD-9-CM    1. Encounter for Medicare annual wellness exam  Z00.00 V70.0       2. Major depressive disorder, recurrent episode, mild (CMS-HCC)  F33.0 296.31       3. Encounter for screening mammogram for malignant neoplasm of breast  Z12.31 V76.12 Tomo Digital Screening Mammography - Bilateral      4. Encounter for colorectal cancer screening  Z12.11 V76.51 Consult/Referral to GI for Colonoscopy Screening    Z12.12 V76.41       5. Hypertension, unspecified type  I10 401.9       6. Hyperlipidemia, unspecified hyperlipidemia type  E78.5 272.4 CBC w/ Diff Lavender      Comprehensive Metabolic Panel      Lipid Panel Green Plasma Separator Tube      7. Anxiety  F41.9 300.00       8. Back spasm  M62.830 724.8 Physical Therapy - Outside (Non-[place])      9. History of basal cell carcinoma (BCC)  Z85.828 V10.83 Dermatology  Consult [facility]      10. Skin cancer screening  Z12.83 V76.43 Dermatology  Consult [facility]            By signing below, I acknowledge that I have reviewed the above note, dictated by me and scribed by Lidia Collum, for accuracy and edited  where necessary. The note accurately reflects the services provided at this  encounter. We retain the right to modify this information in the event of  errors. Occasional errors in punctuation, grammar and content may occur.        Electronically reviewed and signed by Wayland Salinas, MD.       [facility]  [email or web address]

## 2021-07-12 NOTE — Assessment & Plan Note (Signed)
Stable  Mammogram ordered  Follow up for results  Patient counseled on importance of routine breast cancer screenings   Advised patient to perform regular self breast exams to monitor for changes   Continue to monitor

## 2021-07-12 NOTE — Assessment & Plan Note (Signed)
Stable  Advised pt to consume a  low  Sugar / low simple carbohydrate diet and the need to increase plant based fiber (25 grams/day min), vegetables and whole grains to help lower cholesterol  Recommend increasing exercise and cont monitoring diet with goal of losing weight  Labs as ordered, follow up for results  Continue to monitor     Lab Results   Component Value Date    CHOL 182 06/04/2020    HDL 62 06/04/2020    LDL 100 (H) 06/04/2020    TRIG 102 06/04/2020

## 2021-07-12 NOTE — Assessment & Plan Note (Signed)
Stable  GI referral submitted for diagnostic colonoscopy   Follow up for results  Patient was counseled on the importance of colorectal screenings and advised on the difference between routine screenings and colonoscopy procedure  Patient expresses understanding   Continue to monitor

## 2021-07-12 NOTE — Assessment & Plan Note (Signed)
Stable  Denies SI/HI  Intermittent anxious mood without specific trigger suggestive of GAD  Discussed utility of counseling to learn techniques for management of anxious mood, patient defers at this time  Okay to take alprazolam as directed PRN for sudden severe anxiety, continue to limit use as often as possible to prevent developing dependence  Okay to continue essential oils if patient is seeing improvement  Continue breathing techniques as patient sees improvement  Continue to monitor  Call office if patient develops any symptoms outside of what we are trying to achieve  RTC or Go to ER if patient develops worsening dysphoric mood, anxiety or panic, thoughts of self harm, or SIHI

## 2021-07-13 ENCOUNTER — Ambulatory Visit (INDEPENDENT_AMBULATORY_CARE_PROVIDER_SITE_OTHER): Admitting: Family Medicine

## 2021-07-13 ENCOUNTER — Encounter (INDEPENDENT_AMBULATORY_CARE_PROVIDER_SITE_OTHER): Payer: Self-pay | Admitting: Family Medicine

## 2021-07-13 VITALS — BP 124/76 | HR 64 | Temp 97.9°F | Resp 16 | Ht 64.0 in | Wt 165.0 lb

## 2021-07-13 NOTE — Assessment & Plan Note (Addendum)
Active  Referral for physical therapy   Stretch as tolerated   Take Tylenol as needed    Ice and heat compressions   Continue to monitor

## 2021-07-13 NOTE — Assessment & Plan Note (Signed)
Stable  Follow up with dermatology as recommended  Limit sun exposure  Continue to monitor

## 2021-07-13 NOTE — Assessment & Plan Note (Signed)
Stable  Follow up with dermatology as recommended  Limit sun exposure  Continue to monitor

## 2021-07-23 LAB — COMPREHENSIVE METABOLIC PANEL, BLOOD
ALT (SGPT): 27 U/L (ref 6–29)
AST (SGOT): 22 U/L (ref 10–35)
Albumin/Glob Ratio: 1.9 (calc) (ref 1.0–2.5)
Albumin: 4.3 g/dL (ref 3.6–5.1)
Alkaline Phos: 64 U/L (ref 37–153)
BUN: 13 mg/dL (ref 7–25)
Bilirubin, Total: 0.6 mg/dL (ref 0.2–1.2)
Calcium: 9.5 mg/dL (ref 8.6–10.4)
Carbon Dioxide: 28 mmol/L (ref 20–32)
Chloride: 107 mmol/L (ref 98–110)
Creatinine: 0.76 mg/dL (ref 0.50–1.05)
EGFR: 86 mL/min/{1.73_m2} (ref >=60)
Globulin: 2.3 g/dL (calc) (ref 1.9–3.7)
Glucose: 118 mg/dL — ABNORMAL HIGH (ref 65–99)
Potassium: 4.3 mmol/L (ref 3.5–5.3)
Sodium: 143 mmol/L (ref 135–146)
Total Protein: 6.6 g/dL (ref 6.1–8.1)

## 2021-07-23 LAB — CBC WITH DIFF, BLOOD
Abs Basophils: 61 cells/uL (ref 0–200)
Abs Eosinophils: 260 cells/uL (ref 15–500)
Abs Lymphs: 2086 cells/uL (ref 850–3900)
Abs Monocytes: 326 cells/uL (ref 200–950)
Abs Neutrophils: 2366 cells/uL (ref 1500–7800)
Basophils: 1.2 %
Eosinophils: 5.1 %
HCT: 40.7 % (ref 35.0–45.0)
HGB: 13.9 g/dL (ref 11.7–15.5)
Lymps: 40.9 %
MCH: 29.3 pg (ref 27.0–33.0)
MCHC: 34.2 g/dL (ref 32.0–36.0)
MCV: 85.7 fL (ref 80.0–100.0)
MPV: 10.5 fL (ref 7.5–12.5)
Monocytes: 6.4 %
PLT: 252 10*3/uL (ref 140–400)
RBC: 4.75 10*6/uL (ref 3.80–5.10)
RDW: 12.7 % (ref 11.0–15.0)
SEGS: 46.4 %
WBC: 5.1 10*3/uL (ref 3.8–10.8)

## 2021-07-23 LAB — HDL-CHOLESTEROL, BLOOD: HDL Cholesterol: 73 mg/dL (ref >=50)

## 2021-07-23 LAB — TRIGLYCERIDES, BLOOD: Triglycerides: 129 mg/dL (ref <150)

## 2021-07-23 LAB — CHOLESTEROL/HDLC RATIO-QUEST: Chol/HDLC Ratio: 3.1 (calc) (ref <5.0)

## 2021-07-23 LAB — CHOLESTEROL, TOTAL BLOOD: Cholesterol: 229 mg/dL — ABNORMAL HIGH (ref <200)

## 2021-07-23 LAB — NON HDL CHOLESTEROL -QUEST: Non-HDL Cholesterol: 156 mg/dL (calc) — ABNORMAL HIGH (ref <130)

## 2021-07-23 LAB — LDL CHOLESTEROL, DIRECT: LDL-Cholesterol: 132 mg/dL (calc) — ABNORMAL HIGH

## 2021-07-27 ENCOUNTER — Encounter (INDEPENDENT_AMBULATORY_CARE_PROVIDER_SITE_OTHER): Payer: Self-pay

## 2021-07-28 NOTE — Result Encounter Note (Signed)
Please advise:  Your cholesterol is high.  The best lifestyle change to help with cholesterol is eating a wide variety of vegetables and limiting sugar/flour/processed foods.  We also recommend regular exercise, at least 30 minutes per day five times per week.   Let's recheck your cholesterol blood test in 6-12 months. If not improving at that time, we may need to discuss starting medication to reduce your risk of heart attack or stroke, if you are not already taking one.   Kidney and liver function are normal.   Your fasting blood sugar is slightly elevated.  This results is consistent with possible prediabetes. This means you are at increased risk of developing diabetes in the future.   Limiting your carbohydrate intake, an active lifestyle, and a heart-healthy diet can prevent progression to diabetes.     Thank you. Elly Modena MD

## 2021-08-16 ENCOUNTER — Encounter (INDEPENDENT_AMBULATORY_CARE_PROVIDER_SITE_OTHER): Payer: Self-pay | Admitting: Family Medicine

## 2021-08-16 ENCOUNTER — Ambulatory Visit (INDEPENDENT_AMBULATORY_CARE_PROVIDER_SITE_OTHER): Admitting: Family Medicine

## 2021-08-16 VITALS — BP 132/79 | HR 65 | Temp 98.6°F | Resp 14 | Ht 64.0 in | Wt 167.0 lb

## 2021-08-16 NOTE — Assessment & Plan Note (Signed)
Stable   Alternate with warm and cool compresses  Discussed the difference between viral, allegic and bacterial conjuctivitis.   F/u if vision changes occur.   Pt verbalized understanding.   f/u if sx persist.

## 2021-08-16 NOTE — Progress Notes (Signed)
[place] - [place]-  [place] - [place] - [place]   [email or web address]  [email or web address]   Telephone: [phone]             Encounter Date:  08/16/2021  10:33 AM   PCP: Adrienne Mocha  MRN: [id number]  DOB: [birth year]       HPI:  Debbie Hudson is a 66 year old female presents   Chief Complaint   Patient presents with    Conjunctivitis     C/o left pink eye since yesterday   Aslo c/o itchiness and swelling      L eye/ sinus pain  Patient c/o headache for past 4 days   States that sx have been worse today since she woke up   C/o itching, sinus pressure and redness for several days in L eye  She denies exposure to sick persons   Endorses taking zyrtec past few days with no relief   Denies using any new products on her face  C/o excessive fatigue past few days  Denies any vision changes  Had previous sinus sx on both sides before covid      PROBLEM  LIST:  Patient Active Problem List   Diagnosis    Anxiety    Herpes zoster without complication    Mass of left foot    Recurrent sinusitis    H/O colonoscopy    Family history of malignant neoplasm of colon    Tendinitis of wrist    Osteoarthrosis    Carpal tunnel syndrome    Allergic rhinitis    Anaclitic depression    Fatigue, unspecified type    Food intolerance    Weight gain    Mild intermittent asthma with exacerbation    Major depressive disorder, recurrent episode, mild (CMS-HCC)    Trigger finger of right hand    Heart palpitations    Inflamed seborrheic keratosis    Back spasm    Neck muscle spasm    Pain of left calf    Viral syndrome    Hypertension, unspecified type    Dizziness    Encounter for Medicare annual wellness exam    Hyperlipidemia, unspecified hyperlipidemia type    Snoring    Mid back pain on right side    Neck pain    Numerous skin moles    Encounter for screening mammogram for malignant neoplasm of breast    Postmenopausal    Encounter for herb and vitamin supplement management    Overweight with body mass index (BMI) of 28 to  28.9 in adult    Annual physical exam    Upper respiratory tract infection, unspecified type    Subacute cough    Medicare annual wellness visit, subsequent    History of basal cell carcinoma (BCC)    Conjunctivitis of left eye, unspecified conjunctivitis type         PAST MEDICAL HISTORY:  Past Medical History:   Diagnosis Date    Anxiety     Asthma 05/07/2021    Chest congestion 01/13/2017    Hypertension 04/07/2020    Major depressive disorder, single episode     Recurrent sinusitis 01/13/2017    Sinus pressure 12/20/2016       PAST SURGICAL HISTORY:  Past Surgical History:   Procedure Laterality Date    SINUS SURGERY  04/17/2017    COLONOSCOPY  11/24/2016    repeat in 5 years    ADENOIDECTOMY  1962    ANTERIOR CRUCIATE LIGAMENT REPAIR Left  CARPAL TUNNEL RELEASE Bilateral     CESAREAN SECTION, CLASSIC  1984, 1986    TONSILLECTOMY AND ADENOIDECTOMY          FAMILY HISTORY:   Family History   Problem Relation Name Age of Onset    Cancer Mother Georgiann Mccoy         Lymphoma    Hypertension Mother Georgiann Mccoy     Cancer Father Nile Riggs         Colon, Chondrosarcoma    Hypertension Father Nile Riggs     Cholesterol/Lipid Disorder Father Nile Riggs     Hypertension Brother Deeann Cree     Hypertension Brother Lauretta Chester          SOCIAL HISTORY:  Social History     Socioeconomic History    Marital status: Married     Spouse name: Not on file    Number of children: 2    Years of education: 2    Highest education level: Not on file   Occupational History    Occupation: Retired / Optometrist   Tobacco Use    Smoking status: Never    Smokeless tobacco: Never   Substance and Sexual Activity    Alcohol use: Not Currently     Comment: occ    Drug use: No    Sexual activity: Not Currently     Partners: Male   Other Topics Concern    Military Service No    Blood Transfusions No    Caffeine Concern No    Occupational Exposure No    Hobby Hazards No    Sleep Concern Yes     Comment: snoring    Stress  Concern No    Weight Concern Yes    Special Diet No    Back Care Yes     Comment: neck stiffness, rt mid back stiffness    Exercises Regularly No    Bike Helmet Use Yes    Seat Belt Use Yes    Performs Self-Exams Yes   Social History Narrative    Not on file     Social Determinants of Health     Financial Resource Strain: Not on file   Food Insecurity: Not on file   Transportation Needs: Not on file   Physical Activity: Not on file   Stress: Not on file   Social Connections: Not on file   Intimate Partner Violence: Not on file   Housing Stability: Not on file     Social History     Tobacco Use   Smoking Status Never   Smokeless Tobacco Never      Social History     Substance and Sexual Activity   Alcohol Use Not Currently    Comment: occ      Social History     Substance and Sexual Activity   Drug Use No        Immunization History   Administered Date(s) Administered    (Shingles) Herpes Zoster Vaccine (ZOSTAVAX) 11/10/2011    Influenza Vaccine (Unspecified) 10/31/1996, 11/30/2007, 11/06/2009, 09/29/2010, 11/19/2013    Influenza Vaccine >=6 Months 10/23/2008, 12/19/2011, 12/10/2012, 10/10/2016    Pneumococcal 23 Vaccine (PNEUMOVAX-23) 09/29/2010    Tdap 08/31/2009         CURRENT  MEDICATIONS:  Current Outpatient Medications on File Prior to Visit   Medication Sig Dispense Refill    [DISCONTINUED] albuterol 108 (90 Base) MCG/ACT inhaler Inhale 2 puffs by mouth every 6 hours as needed  for Wheezing. 3 each 1    ALPRAZolam (XANAX) 0.25 MG tablet Take 1 tablet (0.25 mg) by mouth nightly as needed for Anxiety. 5 tablet 0    [DISCONTINUED] benzonatate (TESSALON) 100 MG capsule Take 1 capsule (100 mg) by mouth every 8 hours as needed for Cough. 30 capsule 0    cetirizine (ZYRTEC) 10 MG tablet Take 1 tablet (10 mg) by mouth daily. 90 tablet 1    fluticasone propionate (FLONASE) 50 MCG/ACT nasal spray Spray 1 spray into each nostril daily.      fluticasone-salmeterol (ADVAIR) 100-50 MCG/ACT inhaler Inhale 1 puff by mouth  every 12 hours. 180 each 1    lisinopril (PRINIVIL, ZESTRIL) 5 MG tablet Take 1 tablet (5 mg) by mouth daily. 90 tablet 2    [DISCONTINUED] methylPREDNISolone (MEDROL DOSEPACK) 4 MG tablet Take as directed on package 1 each 0    tizanidine (ZANAFLEX) 2 MG tablet tizanidine 2 mg tablet   PRN       No current facility-administered medications on file prior to visit.     No outpatient medications have been marked as taking for the 08/16/21 encounter (Office Visit) with Adrienne Mocha, MD.        ALLERGIES:    Allergies   Allergen Reactions    Genecof-Xp Hives    Penicillin G Rash    Penicillins Rash    Dilaudid  [Hydromorphone Hcl] Itching    Hydrocodone Itching    Venlafaxine Unspecified     Felt weird          REVIEW OF SYSTEMS:  Negative Except in HPI.   Review of Systems   Constitutional: Negative.    Respiratory:  Negative for cough, shortness of breath and wheezing.    Cardiovascular:  Negative for chest pain.   All other systems reviewed and are negative.            PHYSICAL EXAM:   08/16/21  1033   BP: 132/79   Pulse: 65   Temp: 98.6 F (37 C)   Resp: 14   SpO2: 98%     Body mass index is 28.67 kg/m.    Ht Readings from Last 1 Encounters:   08/16/21 5\' 4"  (1.626 m)     Wt Readings from Last 1 Encounters:   08/16/21 75.8 kg (167 lb)           Physical Exam  Vitals and nursing note reviewed.   Constitutional:       Appearance: She is well-developed.   Eyes:      Comments: L eye:   Slight redness   Cardiovascular:      Rate and Rhythm: Normal rate and regular rhythm.      Heart sounds: Normal heart sounds.   Pulmonary:      Effort: Pulmonary effort is normal.      Breath sounds: Normal breath sounds.   Skin:     General: Skin is warm and dry.   Psychiatric:         Behavior: Behavior normal.                  ASSESSMENT & PLAN:    Malayla was seen today for conjunctivitis.    Diagnoses and all orders for this visit:    Conjunctivitis of left eye, unspecified conjunctivitis type  Assessment & Plan:  Stable    Alternate with warm and cool compresses  Discussed the difference between viral, allegic and bacterial conjuctivitis.   F/u if  vision changes occur.   Pt verbalized understanding.   f/u if sx persist.          CONSIDER ABX IF NOT BETTER, IE SINUS AND EYE      ICD-10-CM ICD-9-CM    1. Conjunctivitis of left eye, unspecified conjunctivitis type  H10.9 372.30                 By signing below, I acknowledge that I have reviewed the above note,  dictated by me and scribed by Towana Badger for accuracy and edited  where necessary. The note accurately reflects the services provided at this encounter. We retain the right to modify this information in the event of errors. Occasional errors in punctuation, grammar and content may occur.      Electronically signed by R. Zenia Resides, MD    [facility]  [email or web address]

## 2021-09-15 ENCOUNTER — Telehealth (INDEPENDENT_AMBULATORY_CARE_PROVIDER_SITE_OTHER): Admitting: Physician Assistant

## 2021-09-15 ENCOUNTER — Encounter (INDEPENDENT_AMBULATORY_CARE_PROVIDER_SITE_OTHER): Payer: Self-pay | Admitting: Physician Assistant

## 2021-09-15 VITALS — BP 120/90 | Temp 100.7°F

## 2021-09-15 MED ORDER — NIRMATRELVIR&RITONAVIR 300/100 20 X 150 MG & 10 X 100MG PO TBPK
ORAL_TABLET | ORAL | 0 refills | Status: DC
Start: 2021-09-15 — End: 2022-04-04

## 2021-09-15 NOTE — Progress Notes (Signed)
Encounter Date:  09/15/21  2:26 PM   PCP: Adrienne Mocha  MRN: [id number]  DOB: 11-Jun-1955    [facility] TELEPHONE SERVICES ENCOUNTER  Pandemic Response Ambulatory Protocol    Evaluator(s):   Cariann Kinnamon is a 66 year old female  who was evaluated by: Margaretha Seeds, PA-C    Statement:    A Relevant History (including allergies, medications, past medical history, relevant review of systems) and relevant exam as performed by the named provider, are as transcribed in this and/or the accompanying note. Please also see the patient questionnaire for details.    Patient Verification & Telemedicine Consent:      -I have verified that the patient's identification to be correct via verbal confirmation of birth date and address & valid: Yes    -The patient, and / or surrogate, has been made aware that patient is to be evaluated today using a home telemedicine visit technique (audio transmission): Yes    - The patient, and / or surrogate, has been made aware that patient has the right to refuse this type of evaluation at any time during the assessment period, and has been made aware of any alternatives to this type of evaluation: Yes    -The patient, and / or surrogate,  has been made aware that patient may need further evaluations in the future: Yes    -The patient, and / or surrogate, has signed a valid Informed Consent document (detailing risks, benefits, alternatives & costs), or is exempt from these requirements by law, which I verify is currently present in the [place] MEDICAL RECORD [id number]    Patient is unable to be seen by a specialist in this clinic because:      Pandemic response ambulatory protocol     HPI:  Debbie Hudson is a 66 year old female presents calling [facility] from home for the following:   Chief Complaint   Patient presents with    URI     Pt c/o headaches, fever, loss of taste, fatigue, cough, congestion xcouple days        URI   Associated symptoms include congestion, coughing, headaches and a  sore throat. Pertinent negatives include no wheezing.     See CC   Spouse have covid as well   No sob   No cp   In no acute distress     PROBLEM  LIST:  Patient Active Problem List   Diagnosis    Anxiety    Herpes zoster without complication    Mass of left foot    Recurrent sinusitis    H/O colonoscopy    Family history of malignant neoplasm of colon    Tendinitis of wrist    Osteoarthrosis    Carpal tunnel syndrome    Allergic rhinitis    Anaclitic depression    Fatigue, unspecified type    Food intolerance    Weight gain    Mild intermittent asthma with exacerbation    Major depressive disorder, recurrent episode, mild (CMS-HCC)    Trigger finger of right hand    Heart palpitations    Inflamed seborrheic keratosis    Back spasm    Neck muscle spasm    Pain of left calf    Viral syndrome    Hypertension, unspecified type    Dizziness    Encounter for Medicare annual wellness exam    Hyperlipidemia, unspecified hyperlipidemia type    Snoring    Mid back pain on right  side    Neck pain    Numerous skin moles    Encounter for screening mammogram for malignant neoplasm of breast    Postmenopausal    Encounter for herb and vitamin supplement management    Overweight with body mass index (BMI) of 28 to 28.9 in adult    Annual physical exam    Upper respiratory tract infection, unspecified type    Subacute cough    Medicare annual wellness visit, subsequent    History of basal cell carcinoma (BCC)    Conjunctivitis of left eye, unspecified conjunctivitis type         PAST MEDICAL HISTORY:  Past Medical History:   Diagnosis Date    Anxiety     Asthma 05/07/2021    Chest congestion 01/13/2017    Hypertension 04/07/2020    Major depressive disorder, single episode     Recurrent sinusitis 01/13/2017    Sinus pressure 12/20/2016       PAST SURGICAL HISTORY:  Past Surgical History:   Procedure Laterality Date    SINUS SURGERY  04/17/2017    COLONOSCOPY  11/24/2016    repeat in 5 years    ADENOIDECTOMY  1962    ANTERIOR CRUCIATE  LIGAMENT REPAIR Left     CARPAL TUNNEL RELEASE Bilateral     CESAREAN SECTION, CLASSIC  1984, 1986    TONSILLECTOMY AND ADENOIDECTOMY          FAMILY HISTORY:   Family History   Problem Relation Name Age of Onset    Cancer Mother Georgiann Mccoy         Lymphoma    Hypertension Mother Georgiann Mccoy     Cancer Father Nile Riggs         Colon, Chondrosarcoma    Hypertension Father Nile Riggs     Cholesterol/Lipid Disorder Father Nile Riggs     Hypertension Brother Deeann Cree     Hypertension Brother Lauretta Chester          SOCIAL HISTORY:  Social History     Socioeconomic History    Marital status: Married     Spouse name: Not on file    Number of children: 2    Years of education: 2    Highest education level: Not on file   Occupational History    Occupation: Retired / Optometrist   Tobacco Use    Smoking status: Never    Smokeless tobacco: Never   Substance and Sexual Activity    Alcohol use: Not Currently     Comment: occ    Drug use: No    Sexual activity: Not Currently     Partners: Male   Other Topics Concern    Military Service No    Blood Transfusions No    Caffeine Concern No    Occupational Exposure No    Hobby Hazards No    Sleep Concern Yes     Comment: snoring    Stress Concern No    Weight Concern Yes    Special Diet No    Back Care Yes     Comment: neck stiffness, rt mid back stiffness    Exercises Regularly No    Bike Helmet Use Yes    Seat Belt Use Yes    Performs Self-Exams Yes   Social History Narrative    Not on file     Social Determinants of Health     Financial Resource Strain: Not on file   Food Insecurity:  Not on file   Transportation Needs: Not on file   Physical Activity: Not on file   Stress: Not on file   Social Connections: Not on file   Intimate Partner Violence: Not on file   Housing Stability: Not on file     Social History     Tobacco Use   Smoking Status Never   Smokeless Tobacco Never      Social History     Substance and Sexual Activity   Alcohol Use Not Currently     Comment: occ      Social History     Substance and Sexual Activity   Drug Use No        Immunization History   Administered Date(s) Administered    (Shingles) Herpes Zoster Vaccine (ZOSTAVAX) 11/10/2011    Influenza Vaccine (Unspecified) 10/31/1996, 11/30/2007, 11/06/2009, 09/29/2010, 11/19/2013    Influenza Vaccine >=6 Months 10/23/2008, 12/19/2011, 12/10/2012, 10/10/2016    Pneumococcal 23 Vaccine (PNEUMOVAX-23) 09/29/2010    Tdap 08/31/2009         CURRENT  MEDICATIONS:  Current Outpatient Medications on File Prior to Visit   Medication Sig Dispense Refill    ALPRAZolam (XANAX) 0.25 MG tablet Take 1 tablet (0.25 mg) by mouth nightly as needed for Anxiety. 5 tablet 0    cetirizine (ZYRTEC) 10 MG tablet Take 1 tablet (10 mg) by mouth daily. 90 tablet 1    fluticasone propionate (FLONASE) 50 MCG/ACT nasal spray Spray 1 spray into each nostril daily.      fluticasone-salmeterol (ADVAIR) 100-50 MCG/ACT inhaler Inhale 1 puff by mouth every 12 hours. 180 each 1    lisinopril (PRINIVIL, ZESTRIL) 5 MG tablet Take 1 tablet (5 mg) by mouth daily. 90 tablet 2    tizanidine (ZANAFLEX) 2 MG tablet tizanidine 2 mg tablet   PRN       No current facility-administered medications on file prior to visit.     Outpatient Medications Marked as Taking for the 09/15/21 encounter [facility]) with Margaretha Seeds, [name]   Medication Sig Dispense Refill    ALPRAZolam (XANAX) 0.25 MG tablet Take 1 tablet (0.25 mg) by mouth nightly as needed for Anxiety. 5 tablet 0    cetirizine (ZYRTEC) 10 MG tablet Take 1 tablet (10 mg) by mouth daily. 90 tablet 1    fluticasone propionate (FLONASE) 50 MCG/ACT nasal spray Spray 1 spray into each nostril daily.      fluticasone-salmeterol (ADVAIR) 100-50 MCG/ACT inhaler Inhale 1 puff by mouth every 12 hours. 180 each 1    lisinopril (PRINIVIL, ZESTRIL) 5 MG tablet Take 1 tablet (5 mg) by mouth daily. 90 tablet 2    tizanidine (ZANAFLEX) 2 MG tablet tizanidine 2 mg tablet   PRN           ALLERGIES:    Allergies   Allergen Reactions    Genecof-Xp Hives    Penicillin G Rash    Penicillins Rash    Dilaudid  [Hydromorphone Hcl] Itching    Hydrocodone Itching    Venlafaxine Unspecified     Felt weird          REVIEW OF SYSTEMS:  Review of Systems   Constitutional:  Positive for chills and fever.   HENT:  Positive for congestion and sore throat.    Respiratory:  Positive for cough. Negative for shortness of breath and wheezing.    Cardiovascular: Negative.    Gastrointestinal: Negative.    Genitourinary: Negative.    Musculoskeletal: Negative.  Neurological:  Positive for headaches. Negative for dizziness, tremors, seizures, syncope, speech difficulty and weakness.   All other systems reviewed and are negative.    Negative except as noted in HPI    PHYSICAL EXAM:  General Impression: healthy, alert, no distress, pleasant affect, cooperative, communicative     09/15/21  1328   BP: 120/90   Temp: 100.7 F (38.2 C)     There is no height or weight on file to calculate BMI.    Ht Readings from Last 1 Encounters:   08/16/21 5\' 4"  (1.626 m)     Wt Readings from Last 1 Encounters:   08/16/21 75.8 kg (167 lb)           Physical Exam        ASSESSMENT & PLAN:    Gae was seen today for uri.    Diagnoses and all orders for this visit:    COVID-19  -     nirmatrelvir-ritonavir 300 mg-100 mg (PAXLOVID) 20 x 150 MG & 10 x 100MG  tablets; Take 2 tablets (300 mg) of nirmatrelvir AND 1 tablet (100 mg) of ritonavir together two times daily for 5 days.    Stable  Covid 19 Infection   Discussed risk verses benefit of starting paxlovid and pt has given informed consent to treatment   Pt understands that paxlovid is not FDA approved and not all drug interactions or side effects are known as it is a new medication   Medication interactions checked   Denies severe SOB  Advised isolation 14 days from known exposure, or 7 days from onset of sx including 3 days wo a fever  Vitamin C 500 mg BID, Zinc 75-100 mg/day, and  Vitamin D3 2000 - 4000 u/day   Start Flonase twice a day and Claritin daily   Over the counter Mucinex DM for cough   Increase water intake and rest    Over the counter Tylenol or ibuprofen for fever or body aches  Advised ER for any severe SOB or fever not responding to OTC medication   Follow up with primary care provider or call clinic in 2-4 days for check up        ICD-10-CM ICD-9-CM    1. COVID-19  U07.1 079.89 nirmatrelvir-ritonavir 300 mg-100 mg (PAXLOVID) 20 x 150 MG & 10 x 100MG  tablets          Patient instructions:  See EPIC instructions.  Reviewed verbally and/or AVS available via MYchart for patient.      Barriers to learning assessed: None.    Patient/family verbalizes understanding and is agreeable to above plan.    Patient was evaluated by Aram Beecham PA-C at [facility]    TIME SPENT ON MEDICAL DISCUSSION INCLUDING BEFORE, DURING, AND AFTER PHONE CONVERSATION: 15 min    Start & Stop time: [phone]      Electronically signed and reviewed by Margaretha Seeds, PA-C    [facility]  [email or web address]

## 2021-11-16 ENCOUNTER — Ambulatory Visit (INDEPENDENT_AMBULATORY_CARE_PROVIDER_SITE_OTHER): Admitting: Family Medicine

## 2021-12-15 NOTE — Result Encounter Note (Signed)
Please advise:  No mammographic evidence of malignancy.  Recommend annual mammography (sooner if any clinical breast changes) and monthly self breast examination.     Take good care, Dr. Tabria Steines

## 2022-04-04 ENCOUNTER — Ambulatory Visit (INDEPENDENT_AMBULATORY_CARE_PROVIDER_SITE_OTHER)

## 2022-04-04 VITALS — BP 110/74 | HR 70 | Temp 97.3°F | Resp 16 | Ht 64.0 in | Wt 166.0 lb

## 2022-04-04 MED ORDER — LISINOPRIL 5 MG OR TABS
5.0000 mg | ORAL_TABLET | Freq: Every day | ORAL | 2 refills | Status: DC
Start: 2022-04-04 — End: 2023-01-10

## 2022-04-04 MED ORDER — ALBUTEROL SULFATE 108 (90 BASE) MCG/ACT IN AERS
2.00 | INHALATION_SPRAY | Freq: Four times a day (QID) | RESPIRATORY_TRACT | 3 refills | Status: AC | PRN
Start: 2022-04-04 — End: ?

## 2022-04-04 MED ORDER — ALPRAZOLAM 0.25 MG OR TABS
0.2500 mg | ORAL_TABLET | Freq: Every evening | ORAL | 0 refills | Status: AC | PRN
Start: 2022-04-04 — End: ?

## 2022-04-04 NOTE — Assessment & Plan Note (Signed)
Stable  Continue current medications/inhalers  Avoid triggers when possible  Advised an air purifier in the home and frequent cleaning of linens and dust to minimize allergens   Return To Clinic if develop increased albuterol inhaler use, nighttime awakenings, or new limitations in activity  ER precautions given if acute exacerbation with severe shortness of breath  Follow up yearly or as needed  Continue to Monitor

## 2022-04-04 NOTE — Assessment & Plan Note (Signed)
Stable  Discussed trial off medication due to good control  Advised home monitoring twice per day while stopping medication  Lisinopril 5 mg refilled in case of need to restart  Blood Pressure   04/04/22 110/74   09/15/21 120/90   08/16/21 132/79      Recommend healthy diet with exercise  DASH diet advised.  Reduce dietary sodium to no more than 2000 mg/day.  Limit alcohol to no more than 2 drinks (ex 24 oz of beer, 10 oz of wine, or 3 oz of liquor) per day in most men and to no more than 1 drink per day in women  Tobacco cessation advised if applicable  Increase fluid intake. Reduce caffeine intake.   Stress reduction advised.   Discussed goal BP < 140/90 if low risk or <130/80 if high risk  F/u as directed.- 6 months

## 2022-04-04 NOTE — Patient Instructions (Signed)
Ear itching: Debrox, mineral oil 1-2 drops daily , ear wax removal camera on [facility]

## 2022-04-04 NOTE — Assessment & Plan Note (Signed)
Stable  Colonoscopy ordered through GI referral   Follow up per results  Continue to Monitor

## 2022-04-04 NOTE — Assessment & Plan Note (Signed)
Stable  2/2 to mild cerumen in canal  Recommend debrox or mineral oil  Follow up as needed   Clean hearing aids regularly  Continue to Monitor

## 2022-04-04 NOTE — Progress Notes (Signed)
Debbie Hudson   [email or web address]  [email or web address]   Telephone: [phone]      CONCERN     Encounter Date:  04/04/22  3:28 PM   PCP: Debbie Hudson  MRN: [id number]   DOB: 1955-11-12     CONCERN:   Debbie Hudson is a 67 year old female presents   Chief Complaint   Patient presents with    Ear Problem     Itching in ear     Other     Skin check on top of eye     Refill Request     Albuterol inhaler and lisinopril and xanax      HPI    #ear itching  Patient notes she wears hearing aids   Has been taking zyrtec for allergies but has not seen any relief   Patient has upcoming travel on Thursdays and was concerned to fly if there was a problem with her ear    #skin spots   Notes 2 skin spots on face that are dry   Has tried exfoliating but lesions continue to return     #asthma  Requests refill of albuterol  Uses when she is ill but notes current inhaler is expired    #anxiety  Patient notes mild anxiety  Symptoms occur without trigger  She does not take xanax often but feels better having the prescription on hand to prevent severe anxiety    #blood pressure  Patient has been taking lisinopril 5 mg for many years  Notes recently her blood pressure has been very well controlled  She is occasionally dizzy and will skip her medication  She inquires about stopping     #colonoscopy  Reports she is due for colonoscopy  Completed q5 years due to family history of colon cancer in father in 31's    Other chronic conditions, if discussed, are documented below in the A/P  Problem List and Family history in the EMR have been reviewed and updated as appropriate    ASSESSMENT/   PLAN       Debbie Hudson was seen today for ear problem, other and refill request.    Diagnoses and all orders for this visit:    Mild intermittent asthma without complication  Assessment & Plan:  Stable  Continue current medications/inhalers  Avoid triggers when possible  Advised an air purifier  in the home and frequent cleaning of linens and dust to minimize allergens   Return To Clinic if develop increased albuterol inhaler use, nighttime awakenings, or new limitations in activity  ER precautions given if acute exacerbation with severe shortness of breath  Follow up yearly or as needed  Continue to Monitor       Orders:  -     albuterol 108 (90 Base) MCG/ACT inhaler; Inhale 2 puffs by mouth every 6 hours as needed for Wheezing.    Hypertension, unspecified type  Assessment & Plan:  Stable  Discussed trial off medication due to good control  Advised home monitoring twice per day while stopping medication  Lisinopril 5 mg refilled in case of need to restart  Blood Pressure   04/04/22 110/74   09/15/21 120/90   08/16/21 132/79      Recommend healthy diet with exercise  DASH diet advised.  Reduce dietary sodium to no more than 2000 mg/day.  Limit alcohol to no more than 2 drinks (ex 24 oz of beer, 10 oz of wine, or 3  oz of liquor) per day in most men and to no more than 1 drink per day in women  Tobacco cessation advised if applicable  Increase fluid intake. Reduce caffeine intake.   Stress reduction advised.   Discussed goal BP < 140/90 if low risk or <130/80 if high risk  F/u as directed.- 6 months      Orders:  -     lisinopril (PRINIVIL, ZESTRIL) 5 MG tablet; Take 1 tablet (5 mg) by mouth daily.    Anxiety  Assessment & Plan:  Stable  Denies SI/HI  Intermittent anxious mood without specific trigger suggestive of GAD  Discussed utility of counseling to learn techniques for management of anxious mood, patient defers at this time  Okay to take alprazolam as directed PRN for sudden severe anxiety, continue to limit use as often as possible to prevent developing dependence  Okay to continue essential oils if patient is seeing improvement  Continue breathing techniques as patient sees improvement  Continue to monitor  Call office if patient develops any symptoms outside of what we are trying to achieve  RTC or  Go to ER if patient develops worsening dysphoric mood, anxiety or panic, thoughts of self harm, or SIHI     Orders:  -     ALPRAZolam (XANAX) 0.25 MG tablet; Take 1 tablet (0.25 mg) by mouth nightly as needed for Anxiety.    Encounter for colorectal cancer screening  Assessment & Plan:  Stable  Colonoscopy ordered through GI referral   Follow up per results  Continue to Monitor       Orders:  -     Gastro for Colonoscopy Screening (Age 13-75) (No GI Symptoms)    Actinic keratoses  Assessment & Plan:  Stable  Return To Clinic for cryotherapy      Ear itching  Assessment & Plan:  Stable  2/2 to mild cerumen in canal  Recommend debrox or mineral oil  Follow up as needed   Clean hearing aids regularly  Continue to Monitor       Other orders  -     CURES Review Documentation - I Reviewed CURES          ICD-10-CM ICD-9-CM    1. Mild intermittent asthma without complication  [id number] albuterol 108 (90 Base) MCG/ACT inhaler      2. Hypertension, unspecified type  I10 401.9 lisinopril (PRINIVIL, ZESTRIL) 5 MG tablet      3. Anxiety  F41.9 300.00 ALPRAZolam (XANAX) 0.25 MG tablet      4. Encounter for colorectal cancer screening  Z12.11 V76.51 Gastro for Colonoscopy Screening (Age 68-75) (No GI Symptoms)    Z12.12 V76.41       5. Actinic keratoses  L57.0 702.0       6. Ear itching  L29.9 698.9               SUPPORTING OBJECTIVE  / SUBJECTIVE     PHYSICAL EXAM:   04/04/22  1510   BP: 110/74   Pulse: 70   Temp: 97.3 F (36.3 C)   Resp: 16     Ht Readings from Last 1 Encounters:   04/04/22 5\' 4"  (1.626 m)     Wt Readings from Last 1 Encounters:   04/04/22 75.3 kg (166 lb)     Body mass index is 28.49 kg/m.        04/04/2022     3:10 PM 08/16/2021    10:33 AM 07/13/2021  10:13 AM 05/07/2021     9:38 AM 04/20/2021     3:24 PM 04/13/2021     2:02 PM   Date Weight Recorded   Metric 75.297 kg 75.751 kg 74.844 kg 71.668 kg 70.308 kg 70.308 kg   Pounds/Ounces 166 lb 167 lb 165 lb 158 lb 155 lb 155 lb        Physical  Exam  Constitutional:       Appearance: Normal appearance.   HENT:      Head: Normocephalic.   Eyes:      Extraocular Movements: Extraocular movements intact.      Conjunctiva/sclera: Conjunctivae normal.      Pupils: Pupils are equal, round, and reactive to light.   Cardiovascular:      Rate and Rhythm: Normal rate and regular rhythm.      Heart sounds: Normal heart sounds.   Pulmonary:      Effort: Pulmonary effort is normal.      Breath sounds: Normal breath sounds.   Musculoskeletal:         General: Normal range of motion.   Skin:     Comments: 2 mm erythematous scaling patches on left forehead and eyelid   Neurological:      General: No focal deficit present.      Mental Status: She is alert and oriented to person, place, and time.   Psychiatric:         Mood and Affect: Mood normal.           ALLERGY / MEDICATIONS / SURGICAL  HISTORY /  SOCIAL HISTORY / IMMUNICATIONS / ROS     ALLERGIES:  Allergies   Allergen Reactions    Genecof-Xp Hives    Penicillin G Rash    Penicillins Rash    Dilaudid  [Hydromorphone Hcl] Itching    Hydrocodone Itching    Venlafaxine Unspecified     Felt weird          CURRENT  MEDICATIONS:  Outpatient Medications Marked as Taking for the 04/04/22 encounter (Office Visit) with Oneta Rack   Medication Sig Dispense Refill    ALPRAZolam (XANAX) 0.25 MG tablet Take 1 tablet (0.25 mg) by mouth nightly as needed for Anxiety. 5 tablet 0    cetirizine (ZYRTEC) 10 MG tablet Take 1 tablet (10 mg) by mouth daily. 90 tablet 1    fluticasone propionate (FLONASE) 50 MCG/ACT nasal spray Spray 1 spray into each nostril daily.      fluticasone-salmeterol (ADVAIR) 100-50 MCG/ACT inhaler Inhale 1 puff by mouth every 12 hours. 180 each 1    lisinopril (PRINIVIL, ZESTRIL) 5 MG tablet Take 1 tablet (5 mg) by mouth daily. 90 tablet 2    tizanidine (ZANAFLEX) 2 MG tablet tizanidine 2 mg tablet   PRN            PAST SURGICAL HISTORY:  Past Surgical History:   Procedure Laterality Date    SINUS SURGERY   04/17/2017    COLONOSCOPY  11/24/2016    repeat in 5 years    [place] Left     CARPAL TUNNEL RELEASE Bilateral     CESAREAN SECTION, CLASSIC  1984, 1986    TONSILLECTOMY AND ADENOIDECTOMY          SOCIAL HISTORY:  Social History     Socioeconomic History    Marital status: Married    Number of children: 2    Years of education: 2  Occupational History    Occupation: Retired / Consulting civil engineer   Tobacco Use    Smoking status: Never    Smokeless tobacco: Never   Substance and Sexual Activity    Alcohol use: Not Currently     Comment: occ    Drug use: No    Sexual activity: Not Currently     Partners: Male   Social Activities of Daily Living Present    Military Service No    Blood Transfusions No    Caffeine Concern No    Occupational Exposure No    Hobby Hazards No    Sleep Concern Yes     Comment: snoring    Stress Concern No    Weight Concern Yes    Special Diet No    Back Care Yes     Comment: neck stiffness, rt mid back stiffness    Exercises Regularly No    Bike Helmet Use Yes    Seat Belt Use Yes    Performs Self-Exams Yes       Immunization History   Administered Date(s) Administered    (Shingles) Herpes Zoster Vaccine (ZOSTAVAX) 11/10/2011    Influenza Vaccine (Unspecified) 10/31/1996, 11/30/2007, 11/06/2009, 09/29/2010, 11/19/2013    Influenza Vaccine >=6 Months 10/23/2008, 12/19/2011, 12/10/2012, 10/10/2016    Pneumococcal 23 Vaccine (PNEUMOVAX-23) 09/29/2010    Tdap 08/31/2009         REVIEW OF SYSTEMS:   (unless listed below, the ROS  is negative except as listed in HPI)  Review of [email or web address]

## 2022-04-04 NOTE — Assessment & Plan Note (Signed)
Stable  Denies SI/HI  Intermittent anxious mood without specific trigger suggestive of GAD  Discussed utility of counseling to learn techniques for management of anxious mood, patient defers at this time  Okay to take alprazolam as directed PRN for sudden severe anxiety, continue to limit use as often as possible to prevent developing dependence  Okay to continue essential oils if patient is seeing improvement  Continue breathing techniques as patient sees improvement  Continue to monitor  Call office if patient develops any symptoms outside of what we are trying to achieve  RTC or Go to ER if patient develops worsening dysphoric mood, anxiety or panic, thoughts of self harm, or SIHI

## 2022-04-04 NOTE — Assessment & Plan Note (Signed)
Stable  Return To Clinic for cryotherapy

## 2022-05-23 ENCOUNTER — Ambulatory Visit (INDEPENDENT_AMBULATORY_CARE_PROVIDER_SITE_OTHER)

## 2022-05-23 VITALS — BP 120/80 | HR 63 | Temp 97.7°F | Resp 16 | Ht 64.0 in | Wt 160.0 lb

## 2022-05-23 MED ORDER — METHYLPREDNISOLONE 4 MG OR KIT
ORAL_TABLET | ORAL | 0 refills | Status: DC
Start: 2022-05-23 — End: 2022-09-27

## 2022-05-23 MED ORDER — AZITHROMYCIN 250 MG OR TABS
ORAL_TABLET | ORAL | 0 refills | Status: AC
Start: 2022-05-23 — End: 2022-05-28

## 2022-05-23 NOTE — Assessment & Plan Note (Signed)
Stable  Recommend over the counter medication like Claritin or Allegra with nasal Flonase  Discussed daily medication use to control symptoms  Advised nasal saline rinses, using a home air purifier, and removing/avoiding allergens   Follow up if symptoms persist  Referral for allergy testing and further work-up  Continue to Monitor

## 2022-05-23 NOTE — Progress Notes (Signed)
[place] - [place]-  [place] - [place] - [place]   [email or web address]  [email or web address]   Telephone: [phone]      CONCERN     Encounter Date:  05/23/22  8:25 AM   PCP: Arley Phenix  MRN: [id number]   DOB: 11/01/1955     CONCERN:   Debbie Hudson is a 67 year old female presents   Chief Complaint   Patient presents with    Cough     Coughing and wheezing x 2 weeks    Fatigue     Pt feeling fatigue x 2 weeks     HPI    #URI   X 2 weeks  + cough with mild clear sticky mucus  - worse at night, wakes her up from sleep   + sore throat - resolved  + itchy eyes  + wheeze  + fatigue  + sick contact in granddaughters     Has tried zyrtec and claritin without improvement   Hast tried Advair inhaler with albuterol - using advair at night, albuterol as needed     Reports history of allergy injections with allergist while at [facility] improvement in her symptoms at that time   Feels symptoms have been worsening     Other chronic conditions, if discussed, are documented below in the A/P  Problem List and Family history in the EMR have been reviewed and updated as appropriate    ASSESSMENT/   PLAN       Othella Kangas was seen today for cough and fatigue.    Diagnoses and all orders for this visit:    Recurrent sinusitis  Assessment & Plan:  Stable  Start medrol pack   If no improvement, complete azithromycin course  See asthma plan   Discussed Over the Counter (OTC) medications for symptom management: Tylenol for fever reduction, NSAIDs for body aches, Mucinex for cough, Flonase and nasal saline for congestion  Return To Clinic if no improvement   Continue to Monitor       Orders:  -     methylPREDNISolone (MEDROL DOSEPACK) 4 MG tablet; Take as directed on package  -     azithromycin (ZITHROMAX) 250 MG tablet; Take 2 tablets (500 mg) by mouth daily for 1 day, THEN 1 tablet (250 mg) daily for 4 days.    Allergic rhinitis, unspecified seasonality, unspecified trigger  Assessment &  Plan:  Stable  Recommend over the counter medication like Claritin or Allegra with nasal Flonase  Discussed daily medication use to control symptoms  Advised nasal saline rinses, using a home air purifier, and removing/avoiding allergens   Follow up if symptoms persist  Referral for allergy testing and further work-up  Continue to Monitor       Orders:  -     Consult/Referral to Allergy/Immunology Clinic    Mild intermittent asthma with exacerbation  Assessment & Plan:  Stable  Continue current medications/inhalers - advised Advair twice per day while symptoms are flaring, albuterol as needed   Avoid triggers when possible  Advised an air purifier in the home and frequent cleaning of linens and dust to minimize allergens   Return To Clinic if develop increased albuterol inhaler use, nighttime awakenings, or new limitations in activity  ER precautions given if acute exacerbation with severe shortness of breath  Follow up yearly or as needed  Continue to Monitor               ICD-10-CM ICD-9-CM  1. Recurrent sinusitis  J32.9 473.9 methylPREDNISolone (MEDROL DOSEPACK) 4 MG tablet      azithromycin (ZITHROMAX) 250 MG tablet      2. Allergic rhinitis, unspecified seasonality, unspecified trigger  J30.9 477.9 Consult/Referral to Allergy/Immunology Clinic      3. Mild intermittent asthma with exacerbation  J45.21 493.92               SUPPORTING OBJECTIVE  / SUBJECTIVE     PHYSICAL EXAM:   05/23/22  0821   BP: 120/80   Pulse: 63   Temp: 97.7 F (36.5 C)   Resp: 16   SpO2: 97%     Ht Readings from Last 1 Encounters:   05/23/22 5\' 4"  (1.626 m)     Wt Readings from Last 1 Encounters:   05/23/22 72.6 kg (160 lb)     Body mass index is 27.46 kg/m.        05/23/2022     8:21 AM 04/04/2022     3:10 PM 08/16/2021    10:33 AM 07/13/2021    10:13 AM 05/07/2021     9:38 AM 04/20/2021     3:24 PM   Date Weight Recorded   Metric 72.576 kg 75.297 kg 75.751 kg 74.844 kg 71.668 kg 70.308 kg   Pounds/Ounces 160 lb 166 lb 167 lb 165 lb 158 lb  155 lb        Physical Exam  Constitutional:       Appearance: Normal appearance.   HENT:      Head: Normocephalic.      Right Ear: Tympanic membrane, ear canal and external ear normal.      Left Ear: Tympanic membrane, ear canal and external ear normal.      Nose: Nose normal.      Mouth/Throat:      Mouth: Mucous membranes are moist.      Pharynx: Oropharynx is clear. No oropharyngeal exudate or posterior oropharyngeal erythema.   Eyes:      Extraocular Movements: Extraocular movements intact.      Conjunctiva/sclera: Conjunctivae normal.      Pupils: Pupils are equal, round, and reactive to light.   Cardiovascular:      Rate and Rhythm: Normal rate and regular rhythm.      Heart sounds: Normal heart sounds.   Pulmonary:      Effort: Pulmonary effort is normal.      Breath sounds: Normal breath sounds. No stridor. No wheezing, rhonchi or rales.   Musculoskeletal:         General: Normal range of motion.   Lymphadenopathy:      Cervical: Cervical adenopathy present.   Neurological:      General: No focal deficit present.      Mental Status: She is alert and oriented to person, place, and time.   Psychiatric:         Mood and Affect: Mood normal.           ALLERGY / MEDICATIONS / SURGICAL  HISTORY /  SOCIAL HISTORY / IMMUNICATIONS / ROS     ALLERGIES:  Allergies   Allergen Reactions    Genecof-Xp Hives    Penicillin G Rash    Penicillins Rash    Dilaudid  [Hydromorphone Hcl] Itching    Hydrocodone Itching    Venlafaxine Unspecified     Felt weird          CURRENT  MEDICATIONS:  Outpatient Medications Marked as Taking for the 05/23/22 encounter (Office Visit)  with Margaree Mackintosh   Medication Sig Dispense Refill    albuterol 108 (90 Base) MCG/ACT inhaler Inhale 2 puffs by mouth every 6 hours as needed for Wheezing. 6.7 g 3    ALPRAZolam (XANAX) 0.25 MG tablet Take 1 tablet (0.25 mg) by mouth nightly as needed for Anxiety. 5 tablet 0    cetirizine (ZYRTEC) 10 MG tablet Take 1 tablet (10 mg) by mouth daily. 90 tablet 1     fluticasone propionate (FLONASE) 50 MCG/ACT nasal spray Spray 1 spray into each nostril daily.      fluticasone-salmeterol (ADVAIR) 100-50 MCG/ACT inhaler Inhale 1 puff by mouth every 12 hours. 180 each 1    lisinopril (PRINIVIL, ZESTRIL) 5 MG tablet Take 1 tablet (5 mg) by mouth daily. 90 tablet 2    tizanidine (ZANAFLEX) 2 MG tablet tizanidine 2 mg tablet   PRN            PAST SURGICAL HISTORY:  Past Surgical History:   Procedure Laterality Date    SINUS SURGERY  04/17/2017    COLONOSCOPY  11/24/2016    repeat in 5 years    ADENOIDECTOMY  1962    ANTERIOR CRUCIATE LIGAMENT REPAIR Left     CARPAL TUNNEL RELEASE Bilateral     CESAREAN SECTION, CLASSIC  1984, 1986    TONSILLECTOMY AND ADENOIDECTOMY          SOCIAL HISTORY:  Social History     Socioeconomic History    Marital status: Married    Number of children: 2    Years of education: 2   Occupational History    Occupation: Retired / Optometrist   Tobacco Use    Smoking status: Never    Smokeless tobacco: Never   Substance and Sexual Activity    Alcohol use: Not Currently     Comment: occ    Drug use: No    Sexual activity: Not Currently     Partners: Male   Social Activities of Daily Living Present    Military Service No    Blood Transfusions No    Caffeine Concern No    Occupational Exposure No    Hobby Hazards No    Sleep Concern Yes     Comment: snoring    Stress Concern No    Weight Concern Yes    Special Diet No    Back Care Yes     Comment: neck stiffness, rt mid back stiffness    Exercises Regularly No    Bike Helmet Use Yes    Seat Belt Use Yes    Performs Self-Exams Yes       Immunization History   Administered Date(s) Administered    (Shingles) Herpes Zoster Vaccine (ZOSTAVAX) 11/10/2011    Influenza Vaccine (Unspecified) 10/31/1996, 11/30/2007, 11/06/2009, 09/29/2010, 11/19/2013    Influenza Vaccine >=6 Months 10/23/2008, 12/19/2011, 12/10/2012, 10/10/2016    Pneumococcal 23 Vaccine (PNEUMOVAX-23) 09/29/2010    Tdap 08/31/2009         REVIEW OF  SYSTEMS:   (unless listed below, the ROS  is negative except as listed in HPI)  Review of Systems            [employer] PA-C  [facility]  [email or web address]

## 2022-05-23 NOTE — Patient Instructions (Signed)
Increase Advair to twice per day while having increased asthma symptoms   Use albuterol (rescue inhaler as needed)    Start steroid pack   If no improvement, complete course of azithromycin     Dr. Chales Abrahams - [facility]

## 2022-05-23 NOTE — Assessment & Plan Note (Signed)
Stable  Start medrol pack   If no improvement, complete azithromycin course  See asthma plan   Discussed Over the Counter (OTC) medications for symptom management: Tylenol for fever reduction, NSAIDs for body aches, Mucinex for cough, Flonase and nasal saline for congestion  Return To Clinic if no improvement   Continue to Monitor

## 2022-05-23 NOTE — Assessment & Plan Note (Signed)
Stable  Continue current medications/inhalers - advised Advair twice per day while symptoms are flaring, albuterol as needed   Avoid triggers when possible  Advised an air purifier in the home and frequent cleaning of linens and dust to minimize allergens   Return To Clinic if develop increased albuterol inhaler use, nighttime awakenings, or new limitations in activity  ER precautions given if acute exacerbation with severe shortness of breath  Follow up yearly or as needed  Continue to Monitor

## 2022-06-09 ENCOUNTER — Encounter (INDEPENDENT_AMBULATORY_CARE_PROVIDER_SITE_OTHER): Payer: Self-pay | Admitting: Hospital

## 2022-09-27 ENCOUNTER — Ambulatory Visit (INDEPENDENT_AMBULATORY_CARE_PROVIDER_SITE_OTHER): Admitting: Family Medicine

## 2022-09-27 ENCOUNTER — Encounter (INDEPENDENT_AMBULATORY_CARE_PROVIDER_SITE_OTHER): Payer: Self-pay | Admitting: Family Medicine

## 2022-09-27 VITALS — BP 118/72 | HR 61 | Temp 96.1°F | Resp 16 | Ht 64.0 in | Wt 169.0 lb

## 2022-09-27 NOTE — Assessment & Plan Note (Signed)
Active   Referral to nutritionist   Eat small, frequent meals, no carbs after 6 pm unless just working out, protein w/ every meal, good sources- 100% whey protein powder, egg whites, beef jerky, cottage cheese, tuna, avoid white breads and sugars, eat complex carbs, fresh fruits and veggies  Discussed the need for increase in Exercise and improvement of diet  Food Journal advised  Recommend Obesity Code by Wylene Simmer, MD  Recommend How to Lose Weight for the Last Time: Brain-Based Solutions for Permanent Weight Loss by Virl Cagey, MD  Continue to monitor

## 2022-09-27 NOTE — Assessment & Plan Note (Signed)
Stable   Patient currently sees these other healthcare providers: see HPI  A 5-10 year schedule of tests/procedures and also community resources has been provided to the patient at this visit.  Vision test done and documented in EMR.  Complete labs if ordered and follow up pending results.     Wellness Exam  The patient is low risk for falls and there are no safety concerns that I am aware of.  There is no evidence of cognitive impairment detected based on my interactions with this patient nor based on any of the medical records I have seen. If present, family members have not brought up concerns to me.  Dietary and exercise habits discussed. I recommended increase intake of fruit and vegetables on a daily basis for optimal health. Exercise as tolerated.  Increase physical activity was encouraged in order to increase level of fitness, strength and reduce long term health problems.  Community based referrals needed at this time: yes  The patient's past medical hx, family history, social history have been reviewed as documented above.  Patient asked about their Advanced Directive. He/she was asked to provide a copy to our office if one is not already on file.   Screening exams completed and preventative screenings reviewed.   Continue to monitor

## 2022-09-27 NOTE — Assessment & Plan Note (Signed)
Stable  Mammogram ordered  Follow up for results  Patient counseled on importance of routine breast cancer screenings   Advised patient to perform regular self breast exams to monitor for changes   Continue to monitor

## 2022-09-27 NOTE — Assessment & Plan Note (Signed)
Stable  Referral to ENT input  If patient is Medicare or PPO, specialist information was given to patient in clinic   Patient advised to establish care for evaluation and treatment   Return to clinic if symptoms worsen or do not improve   Continue to monitor

## 2022-09-27 NOTE — Assessment & Plan Note (Signed)
Stable  Referral submitted to dermatology for consult and evaluation  Patient advised to establish care  Use of sunscreen advised   Continue to monitor  m

## 2022-09-27 NOTE — Assessment & Plan Note (Signed)
Active   Avoid strenous activities  Exercise and stretch regularly, take over the counter pain medications prn, sleep in a comfortable position and avoid sitting for long periods  Recommend heat and ice compressions   Consider physical therapy, referral input   Complete x-ray as ordered, follow up for results   Continue to monitor

## 2022-09-27 NOTE — Assessment & Plan Note (Signed)
Active   Noted on DEXA 06/09/2020   Supplementation with [phone] mg of calcium, 250-500 mg of magnesium advised.  Daily intake of 1,000 to 5,000 IU Vitamin D.  Advised dietary intake calcium rich foods: yogurt, cheese and milk.   Diet high in fruits and vegetables, which contain calcium, magnesium and vitamin K should be considered.   Recommend weight bearing exercises, cautiously avoiding falls or injuries.   Avoid soda, which weakens bones due to its high phosphoric acid content.   Repeat DEXA in 2027  Continue to monitor.

## 2022-09-27 NOTE — Progress Notes (Signed)
[place] - [place]-  [place] - [place] - [place]   [email or web address] and [email or web address]  Telephone: [phone]             Encounter Date:  09/27/2022  10:50 AM PDT     PCP: Arley Phenix  MRN: [id number]  DOB: 05-06-1955     CC: Physical (Pt presents to clinic for yearly physical.//C/o joint pain/C/o bowel issues after colonoscopy 07/26/2022/C/o mid back pain/C/o itchy ears/C/o hair loss/C/o weight loss.)       HPI:  Debbie Hudson is a 67 year old female presents   Chief Complaint   Patient presents with    Physical     Pt presents to clinic for yearly physical.    C/o joint pain  C/o bowel issues after colonoscopy 07/26/2022  C/o mid back pain  C/o itchy ears  C/o hair loss  C/o weight loss.       Patient is followed by the following specialist(s):  None reported     Equipment for mobility:  None     Annual Wellness Exam:   Last CPE: 07/12/2021  Last dental exam: up to date   Last eye exam: up to date     Advance Care Planning   Advance Care Plan and/or Health Care Agent: Advance Directive reviewed today and will be scanned into chart.    Immunization History   Administered Date(s) Administered    (Shingles) Herpes Zoster Vaccine (ZOSTAVAX) 11/10/2011    Influenza Vaccine (Unspecified) 10/31/1996, 11/30/2007, 11/06/2009, 09/29/2010, 11/19/2013    Influenza Vaccine >=6 Months 10/23/2008, 12/19/2011, 12/10/2012, 10/10/2016    Pneumococcal 23 Vaccine (PNEUMOVAX-23) 09/29/2010    Tdap 08/31/2009   Patient counseled      Tobacco Use: none   EtOH use: social   Illicit drug use: none   Diet: no restrictions   Exercise: Denies chest pain, SOB or palpitations with exertion.   Memory: none per patient   Falls: none reported   Depression/anxiety: patient denies     Last Colon [place] screening: 07/26/2022 with 2 polyps removed, admits to constipation, denies diarrhea or blood in stool.  Last DEXA: 06/09/2020 osteopenia   Last Mammogram: 12/07/2021  negative - Denies breast lumps, bumps or pains.  Performs self breast exams.   Last pap: aged out     FMHx:    Family History   Problem Relation Name Age of Onset    Cancer Mother Georgiann Mccoy         Lymphoma    Hypertension Mother Georgiann Mccoy     Cancer Father Nile Riggs         Colon, Chondrosarcoma    Hypertension Father Nile Riggs     Cholesterol/Lipid Disorder Father Nile Riggs     Hypertension Brother Deeann Cree     Hypertension Brother Lauretta Chester       SocialHx:   Past Surgical History:   Procedure Laterality Date    SINUS SURGERY  04/17/2017    COLONOSCOPY  11/24/2016    repeat in 5 years    ADENOIDECTOMY  1962    ANTERIOR CRUCIATE LIGAMENT REPAIR Left     CARPAL TUNNEL RELEASE Bilateral     CESAREAN SECTION, CLASSIC  1984, 1986    TONSILLECTOMY AND ADENOIDECTOMY         Concerns:    Weight   Patient reports difficulty losing weight  She used [place] program in the past but does not desire the financial committment  She eats a protein bar +  coffee in the morning, little to light lunch, and full dinner meal  She does not count her calories       09/27/2022    10:41 AM 05/23/2022     8:21 AM 04/04/2022     3:10 PM 08/16/2021    10:33 AM 07/13/2021    10:13 AM 05/07/2021     9:38 AM   Date Weight Recorded   Metric 76.658 kg 72.576 kg 75.297 kg 75.751 kg 74.844 kg 71.668 kg   Pounds/Ounces 169 lb 160 lb 166 lb 167 lb 165 lb 158 lb         09/27/2022    10:41 AM 05/23/2022     8:21 AM 04/04/2022     3:10 PM 08/16/2021    10:33 AM 07/13/2021    10:13 AM 05/07/2021     9:38 AM   Date BMI Recorded   BMI 29.01 kg/m2 27.46 kg/m2 28.49 kg/m2 28.67 kg/m2 28.32 kg/m2 27.12 kg/m2       Constipation   Patient reports father had colon cancer  She completed her colonoscopy 07/26/2022 and notes that 2 polyps were removed  She has noticed constipation since the procedure  Patient reports small stools     Joint pain  Patient reports joint pain   She takes ibuprofen as needed    Patient has completed physical therapy for back pain in the past, had relief but notes that  symptoms returned once she stopped   Patient desires too repeat     Ear irritation  Patient notes that she wears hering aids, she has noticed some dryness in the canal where the probe sits    Androgenic alopecia  Patient has noticed hair loss over time  She is noticing thinning in the crown and part of her hair   She desires a derm referral    Skin cancer screening   Patient reports she previously had a skin biopsy but does not recall what the outcome was  She was previously with derm in [place]     Other chronic conditions, if discussed, are documented below in the A/P      PROBLEM  LIST:  Patient Active Problem List   Diagnosis    Anxiety    Herpes zoster without complication    Mass of left foot    Recurrent sinusitis    H/O colonoscopy    Family history of malignant neoplasm of colon    Tendinitis of wrist    Osteoarthrosis    Carpal tunnel syndrome    Allergic rhinitis    Anaclitic depression    Fatigue, unspecified type    Food intolerance    Weight gain    Mild intermittent asthma with exacerbation    Major depressive disorder, recurrent episode, mild (CMS-HCC)    Trigger finger of right hand    Heart palpitations    Inflamed seborrheic keratosis    Back spasm    Neck muscle spasm    Pain of left calf    Viral syndrome    Hypertension, unspecified type    Dizziness    Encounter for Medicare annual wellness exam    Hyperlipidemia, unspecified hyperlipidemia type    Snoring    Chronic bilateral thoracic back pain    Neck pain    Numerous skin moles    Encounter for screening mammogram for malignant neoplasm of breast    Postmenopausal    Encounter for herb and vitamin supplement management    Skin cancer screening  Overweight with body mass index (BMI) of 28 to 28.9 in adult    Upper respiratory tract infection, unspecified type    Subacute cough    Medicare annual wellness visit, subsequent    Encounter for colorectal cancer screening    History of basal cell carcinoma (BCC)    Conjunctivitis of left eye,  unspecified conjunctivitis type    Actinic keratoses    Ear itching    Constipation, unspecified constipation type    Osteopenia, unspecified location    Androgenetic alopecia    Impaired fasting glucose    Discomfort of ear, unspecified laterality         PAST MEDICAL HISTORY:  Past Medical History:   Diagnosis Date    Anxiety     Asthma 05/07/2021    Chest congestion 01/13/2017    Hypertension 04/07/2020    Major depressive disorder, single episode     Recurrent sinusitis 01/13/2017    Sinus pressure 12/20/2016       PAST SURGICAL HISTORY:  Past Surgical History:   Procedure Laterality Date    SINUS SURGERY  04/17/2017    COLONOSCOPY  11/24/2016    repeat in 5 years    ADENOIDECTOMY  1962    ANTERIOR CRUCIATE LIGAMENT REPAIR Left     CARPAL TUNNEL RELEASE Bilateral     CESAREAN SECTION, CLASSIC  1984, 1986    TONSILLECTOMY AND ADENOIDECTOMY          FAMILY HISTORY:   Family History   Problem Relation Name Age of Onset    Cancer Mother Georgiann Mccoy         Lymphoma    Hypertension Mother Georgiann Mccoy     Cancer Father Nile Riggs         Colon, Chondrosarcoma    Hypertension Father Nile Riggs     Cholesterol/Lipid Disorder Father Nile Riggs     Hypertension Brother Deeann Cree     Hypertension Brother Lauretta Chester          SOCIAL HISTORY:  Social History     Socioeconomic History    Marital status: Married    Number of children: 2    Years of education: 2   Occupational History    Occupation: Retired / Optometrist   Tobacco Use    Smoking status: Never    Smokeless tobacco: Never   Substance and Sexual Activity    Alcohol use: Not Currently     Comment: occ    Drug use: No    Sexual activity: Not Currently     Partners: Male   Other Topics Concern    Military Service No    Blood Transfusions No    Caffeine Concern No    Occupational Exposure No    Hobby Hazards No    Sleep Concern Yes     Comment: snoring    Stress Concern No    Weight Concern Yes    Special Diet No    Back Care Yes     Comment:  neck stiffness, rt mid back stiffness    Exercises Regularly No    Bike Helmet Use Yes    Seat Belt Use Yes    Performs Self-Exams Yes     Social History     Tobacco Use   Smoking Status Never   Smokeless Tobacco Never      Social History     Substance and Sexual Activity   Alcohol Use Not Currently    Comment:  occ      Social History     Substance and Sexual Activity   Drug Use No        Immunization History   Administered Date(s) Administered    (Shingles) Herpes Zoster Vaccine (ZOSTAVAX) 11/10/2011    Influenza Vaccine (Unspecified) 10/31/1996, 11/30/2007, 11/06/2009, 09/29/2010, 11/19/2013    Influenza Vaccine >=6 Months 10/23/2008, 12/19/2011, 12/10/2012, 10/10/2016    Pneumococcal 23 Vaccine (PNEUMOVAX-23) 09/29/2010    Tdap 08/31/2009         CURRENT  MEDICATIONS:  Current Outpatient Medications on File Prior to Visit   Medication Sig Dispense Refill    albuterol 108 (90 Base) MCG/ACT inhaler Inhale 2 puffs by mouth every 6 hours as needed for Wheezing. 6.7 g 3    ALPRAZolam (XANAX) 0.25 MG tablet Take 1 tablet (0.25 mg) by mouth nightly as needed for Anxiety. 5 tablet 0    cetirizine (ZYRTEC) 10 MG tablet Take 1 tablet (10 mg) by mouth daily. 90 tablet 1    fluticasone propionate (FLONASE) 50 MCG/ACT nasal spray Spray 1 spray into each nostril daily.      fluticasone-salmeterol (ADVAIR) 100-50 MCG/ACT inhaler Inhale 1 puff by mouth every 12 hours. 180 each 1    lisinopril (PRINIVIL, ZESTRIL) 5 MG tablet Take 1 tablet (5 mg) by mouth daily. 90 tablet 2    [DISCONTINUED] methylPREDNISolone (MEDROL DOSEPACK) 4 MG tablet Take as directed on package 1 each 0    tizanidine (ZANAFLEX) 2 MG tablet tizanidine 2 mg tablet   PRN       No current facility-administered medications on file prior to visit.     Outpatient Medications Marked as Taking for the 09/27/22 encounter (Office Visit) with Kandis Cocking, MD   Medication Sig Dispense Refill    albuterol 108 (90 Base) MCG/ACT inhaler Inhale 2 puffs by mouth every 6  hours as needed for Wheezing. 6.7 g 3    ALPRAZolam (XANAX) 0.25 MG tablet Take 1 tablet (0.25 mg) by mouth nightly as needed for Anxiety. 5 tablet 0    cetirizine (ZYRTEC) 10 MG tablet Take 1 tablet (10 mg) by mouth daily. 90 tablet 1    fluticasone propionate (FLONASE) 50 MCG/ACT nasal spray Spray 1 spray into each nostril daily.      fluticasone-salmeterol (ADVAIR) 100-50 MCG/ACT inhaler Inhale 1 puff by mouth every 12 hours. 180 each 1    lisinopril (PRINIVIL, ZESTRIL) 5 MG tablet Take 1 tablet (5 mg) by mouth daily. 90 tablet 2    tizanidine (ZANAFLEX) 2 MG tablet tizanidine 2 mg tablet   PRN          ALLERGIES:    Allergies   Allergen Reactions    Genecof-Xp Hives    Penicillin G Rash    Penicillins Rash    Dilaudid  [Hydromorphone Hcl] Itching    Hydrocodone Itching    Venlafaxine Unspecified     Felt weird          REVIEW OF SYSTEMS:  Review of Systems  Negative except as noted in HPI         PHYSICAL EXAM:   09/27/22  1041   BP: 118/72   Pulse: 61   Temp: 96.1 F (35.6 C)   Resp: 16   SpO2: 97%     Body mass index is 29.01 kg/m.      Ht Readings from Last 1 Encounters:   09/27/22 5\' 4"  (1.626 m)     Wt Readings from Last 1 Encounters:  09/27/22 76.7 kg (169 lb)         09/27/2022    10:41 AM 05/23/2022     8:21 AM 04/04/2022     3:10 PM 08/16/2021    10:33 AM 07/13/2021    10:13 AM 05/07/2021     9:38 AM   Date Weight Recorded   Metric 76.658 kg 72.576 kg 75.297 kg 75.751 kg 74.844 kg 71.668 kg   Pounds/Ounces 169 lb 160 lb 166 lb 167 lb 165 lb 158 lb            Physical Exam  Vitals and nursing note reviewed.   Constitutional:       Appearance: She is well-developed.   Cardiovascular:      Rate and Rhythm: Normal rate and regular rhythm.      Heart sounds: Normal heart sounds.   Pulmonary:      Effort: Pulmonary effort is normal.      Breath sounds: Normal breath sounds.   Skin:     General: Skin is warm and dry.   Psychiatric:         Behavior: Behavior normal.                  ASSESSMENT & PLAN:    Raylynne Loeber was seen today for physical.    Diagnoses and all orders for this visit:    Medicare annual wellness visit, subsequent  Assessment & Plan:  Stable   Patient currently sees these other healthcare providers: see HPI  A 5-10 year schedule of tests/procedures and also community resources has been provided to the patient at this visit.  Vision test done and documented in EMR.  Complete labs if ordered and follow up pending results.     Wellness Exam  The patient is low risk for falls and there are no safety concerns that I am aware of.  There is no evidence of cognitive impairment detected based on my interactions with this patient nor based on any of the medical records I have seen. If present, family members have not brought up concerns to me.  Dietary and exercise habits discussed. I recommended increase intake of fruit and vegetables on a daily basis for optimal health. Exercise as tolerated.  Increase physical activity was encouraged in order to increase level of fitness, strength and reduce long term health problems.  Community based referrals needed at this time: yes  The patient's past medical hx, family history, social history have been reviewed as documented above.  Patient asked about their Advanced Directive. He/she was asked to provide a copy to our office if one is not already on file.   Screening exams completed and preventative screenings reviewed.   Continue to monitor        Overweight with body mass index (BMI) of 28 to 28.9 in adult  Assessment & Plan:  Active   Referral to nutritionist   Eat small, frequent meals, no carbs after 6 pm unless just working out, protein w/ every meal, good sources- 100% whey protein powder, egg whites, beef jerky, cottage cheese, tuna, avoid white breads and sugars, eat complex carbs, fresh fruits and veggies  Discussed the need for increase in Exercise and improvement of diet  Food Journal advised  Recommend Obesity Code by Wylene Simmer, MD  Recommend How to Lose  Weight for the Last Time: [employer] for Permanent Weight Loss by Virl Cagey, MD  Continue to monitor      Orders:  -  Consult/Referral to Outpatient General Nutrition    Constipation, unspecified constipation type  Assessment & Plan:  Active  Try to avoid daily stool softener use to reduce risk of dependence   Increase dietary fiber supplementation  Maintain water intake   Recommend laxatives such as miralax over the counter -- take with 4-8 oz of water at once   Return to clinic if symptoms worsen   Continue to monitor         Osteopenia, unspecified location  Assessment & Plan:  Active   Noted on DEXA 06/09/2020   Supplementation with [phone] mg of calcium, 250-500 mg of magnesium advised.  Daily intake of 1,000 to 5,000 IU Vitamin D.  Advised dietary intake calcium rich foods: yogurt, cheese and milk.   Diet high in fruits and vegetables, which contain calcium, magnesium and vitamin K should be considered.   Recommend weight bearing exercises, cautiously avoiding falls or injuries.   Avoid soda, which weakens bones due to its high phosphoric acid content.   Repeat DEXA in 2027  Continue to monitor.      Orders:  -     Vitamin D, 25-OH Total Yellow serum separator tube    Chronic bilateral thoracic back pain  Assessment & Plan:  Active   Avoid strenous activities  Exercise and stretch regularly, take over the counter pain medications prn, sleep in a comfortable position and avoid sitting for long periods  Recommend heat and ice compressions   Consider physical therapy, referral input   Complete x-ray as ordered, follow up for results   Continue to monitor      Orders:  -     Physical Therapy - Outside (Non-[place])  -     X-Ray Thoracic Spine 3 Views    Androgenetic alopecia  Assessment & Plan:  Active  Complete labs as ordered and follow up pending results  Recommend applying topical rosemary and mint oil directly to the scalp  Recommend supplementing with vitamin B12 and D  Recommend over the  counter Rogaine, okay to use men's for stronger dose, patient counseled on side effects of topical Rogaine   If topical Rogaine is not tolerated, can consider oral   Patient counseled on risks and benefits  Patient advised on need for consistent use   Discussed Nutrafol with patient   Referral to dermatology for further management   Limit processed foods and sugars  Return to clinic if symptoms persist or do not improve  Continue to monitor       Orders:  -     Dermatology  Consult [facility]    Skin cancer screening  Assessment & Plan:  Stable  Referral submitted to dermatology for consult and evaluation  Patient advised to establish care  Use of sunscreen advised   Continue to monitor  m    Orders:  -     Dermatology  Consult [facility]    Impaired fasting glucose  Assessment & Plan:  Stable  Check labs as directed and follow up pending results   Patient was counseled regarding the need to exercise consistently to prevent progression of the condition  Recommend moderate exercise for minimum of 30 mins/day   Patient was counseled regarding the need to implement health dietary choices   Consider low carbohydrates, low sodium, and low processed foods diet   Continue to monitor     Lab Results   Component Value Date    GLU 118 (H) 07/22/2021  Orders:  -     Glycosylated Hgb(A1C), Blood Lavender    Hyperlipidemia, unspecified hyperlipidemia type  Assessment & Plan:  Stable  Current medication: none   Advised patient to consume a low sugar / low simple carbohydrate diet and the need to increase plant based fiber (25 grams/day min), vegetables and whole grains to help lower cholesterol  Recommend increasing exercise and monitoring diet with goal of losing weight  Complete labs as ordered and follow up pending results   Continue to monitor     Lab Results   Component Value Date    CHOL 229 (H) 07/22/2021    HDL 73 07/22/2021    LDL 132 (H) 07/22/2021    TRIG 129 07/22/2021          Orders:  -     CBC w/ Diff Lavender  -      Comprehensive Metabolic Panel  -     Lipid Panel Green Plasma Separator Tube  -     TSH w/ Reflex to FT4    Encounter for screening mammogram for malignant neoplasm of breast  Assessment & Plan:  Stable  Mammogram ordered  Follow up for results  Patient counseled on importance of routine breast cancer screenings   Advised patient to perform regular self breast exams to monitor for changes   Continue to monitor      Orders:  -     [facility] Digital Screening Mammography - Bilateral    Discomfort of ear, unspecified laterality  Assessment & Plan:  Stable  Referral to ENT input  If patient is Medicare or PPO, specialist information was given to patient in clinic   Patient advised to establish care for evaluation and treatment   Return to clinic if symptoms worsen or do not improve   Continue to monitor     Orders:  -     ENT Referral          ICD-10-CM ICD-9-CM    1. Medicare annual wellness visit, subsequent  Z00.00 V70.0       2. Overweight with body mass index (BMI) of 28 to 28.9 in adult  E66.3 278.02 Consult/Referral to Outpatient General Nutrition    Z68.28 V85.24       3. Constipation, unspecified constipation type  K59.00 564.00       4. Osteopenia, unspecified location  M85.80 733.90 Vitamin D, 25-OH Total Yellow serum separator tube      5. Chronic bilateral thoracic back pain  M54.6 724.1 Physical Therapy - Outside (Non-[place])    G89.29 338.29 X-Ray Thoracic Spine 3 Views      6. Androgenetic alopecia  L64.9 704.09 Dermatology  Consult [facility]      7. Skin cancer screening  Z12.83 V76.43 Dermatology  Consult [facility]      8. Impaired fasting glucose  R73.01 790.21 Glycosylated Hgb(A1C), Blood Lavender      9. Hyperlipidemia, unspecified hyperlipidemia type  E78.5 272.4 CBC w/ Diff Lavender      Comprehensive Metabolic Panel      Lipid Panel Green Plasma Separator Tube      TSH w/ Reflex to FT4      10. Encounter for screening mammogram for malignant neoplasm of breast  Z12.31 V76.12 [facility] Digital Screening Mammography -  Bilateral      11. Discomfort of ear, unspecified laterality  H92.09 388.70 ENT Referral            By signing below, I acknowledge that I have reviewed the  above note, dictated by me and scribed by Lidia Collum, for accuracy and edited  where necessary. The note accurately reflects the services provided at this  encounter. We retain the right to modify this information in the event of  errors. Occasional errors in punctuation, grammar and content may occur.        Electronically reviewed and signed by Wayland Salinas, MD.       [facility]  [email or web address]

## 2022-09-27 NOTE — Assessment & Plan Note (Signed)
Stable  Current medication: none   Advised patient to consume a low sugar / low simple carbohydrate diet and the need to increase plant based fiber (25 grams/day min), vegetables and whole grains to help lower cholesterol  Recommend increasing exercise and monitoring diet with goal of losing weight  Complete labs as ordered and follow up pending results   Continue to monitor     Lab Results   Component Value Date    CHOL 229 (H) 07/22/2021    HDL 73 07/22/2021    LDL 132 (H) 07/22/2021    TRIG 129 07/22/2021

## 2022-09-27 NOTE — Assessment & Plan Note (Signed)
Active  Try to avoid daily stool softener use to reduce risk of dependence   Increase dietary fiber supplementation  Maintain water intake   Recommend laxatives such as miralax over the counter -- take with 4-8 oz of water at once   Return to clinic if symptoms worsen   Continue to monitor

## 2022-09-27 NOTE — Patient Instructions (Signed)
For constipation, try over the counter Miralax    Supplements for Osteoarthritis:    ? Turmeric (Curcumin) 500 mg up to 3-4 times per day (1500-2000 mg daily)  ? Fish Oil 2,000 mg to 3,000 mg daily (can take in divided doses, ie twice per day)  ? [place] - Take at least 150 mg taken up to three times per day as needed  ? Ginger 25 mg per day  ? Glucosamine-Chondroitin Sulfate - take as directed on the label  ? SAM-e is also effective in lowering pain associated with arthritis. Take 400 mg 2-  3 times per day.  ? Bromelain is an enzyme extracted from pineapples. Take 500 mg 2-3 times per   day  *Consider taking 1-3 of the above to optimize results. These can be purchased at your   local pharmacy, [employer], [email or web address], [facility] or online.      OTC pharmaceuticals:  ? Tylenol 500-650 mg up to 4 times per day for pain  ? Voltaren topical gel 1% - apply 3-4 times per day to affected area  ? Aspercream apply 3-4 times per day to affected area

## 2022-09-27 NOTE — Assessment & Plan Note (Signed)
Active  Complete labs as ordered and follow up pending results  Recommend applying topical rosemary and mint oil directly to the scalp  Recommend supplementing with vitamin B12 and D  Recommend over the counter Rogaine, okay to use men's for stronger dose, patient counseled on side effects of topical Rogaine   If topical Rogaine is not tolerated, can consider oral   Patient counseled on risks and benefits  Patient advised on need for consistent use   Discussed Nutrafol with patient   Referral to dermatology for further management   Limit processed foods and sugars  Return to clinic if symptoms persist or do not improve  Continue to monitor

## 2022-09-27 NOTE — Assessment & Plan Note (Signed)
Stable  Check labs as directed and follow up pending results   Patient was counseled regarding the need to exercise consistently to prevent progression of the condition  Recommend moderate exercise for minimum of 30 mins/day   Patient was counseled regarding the need to implement health dietary choices   Consider low carbohydrates, low sodium, and low processed foods diet   Continue to monitor     Lab Results   Component Value Date    GLU 118 (H) 07/22/2021

## 2022-10-01 LAB — COMPREHENSIVE METABOLIC PANEL, BLOOD
ALT (SGPT): 25 U/L (ref 6–29)
AST (SGOT): 22 U/L (ref 10–35)
Albumin/Glob Ratio: 2 (calc) (ref 1.0–2.5)
Albumin: 4.3 g/dL (ref 3.6–5.1)
Alkaline Phos: 72 U/L (ref 37–153)
BUN: 13 mg/dL (ref 7–25)
Bilirubin, Total: 0.6 mg/dL (ref 0.2–1.2)
Calcium: 9.3 mg/dL (ref 8.6–10.4)
Carbon Dioxide: 27 mmol/L (ref 20–32)
Chloride: 106 mmol/L (ref 98–110)
Creatinine: 0.73 mg/dL (ref 0.50–1.05)
EGFR: 90 mL/min/{1.73_m2} (ref >=60)
Globulin: 2.2 g/dL (calc) (ref 1.9–3.7)
Glucose: 92 mg/dL (ref 65–99)
Potassium: 4.5 mmol/L (ref 3.5–5.3)
Sodium: 141 mmol/L (ref 135–146)
Total Protein: 6.5 g/dL (ref 6.1–8.1)

## 2022-10-01 LAB — CHOLESTEROL, TOTAL BLOOD: Cholesterol: 222 mg/dL — ABNORMAL HIGH (ref <200)

## 2022-10-01 LAB — TSH W/REFLEX TO FT4-QUEST: TSH: 1.17 mIU/L (ref 0.40–4.50)

## 2022-10-01 LAB — CBC WITH DIFF, BLOOD
Abs Basophils: 51 cells/uL (ref 0–200)
Abs Eosinophils: 103 cells/uL (ref 15–500)
Abs Lymphs: 2172 cells/uL (ref 850–3900)
Abs Monocytes: 393 cells/uL (ref 200–950)
Abs Neutrophils: 2981 cells/uL (ref 1500–7800)
Basophils: 0.9 %
Eosinophils: 1.8 %
HCT: 43 % (ref 35.0–45.0)
HGB: 14.2 g/dL (ref 11.7–15.5)
Lymps: 38.1 %
MCH: 28.6 pg (ref 27.0–33.0)
MCHC: 33 g/dL (ref 32.0–36.0)
MCV: 86.5 fL (ref 80.0–100.0)
MPV: 11 fL (ref 7.5–12.5)
Monocytes: 6.9 %
PLT: 242 10*3/uL (ref 140–400)
RBC: 4.97 10*6/uL (ref 3.80–5.10)
RDW: 12.4 % (ref 11.0–15.0)
SEGS: 52.3 %
WBC: 5.7 10*3/uL (ref 3.8–10.8)

## 2022-10-01 LAB — LDL CHOLESTEROL, DIRECT: LDL-Cholesterol: 133 mg/dL (calc) — ABNORMAL HIGH

## 2022-10-01 LAB — CHOLESTEROL/HDLC RATIO-QUEST: Chol/HDLC Ratio: 3.1 (calc) (ref <5.0)

## 2022-10-01 LAB — TRIGLYCERIDES, BLOOD: Triglycerides: 85 mg/dL (ref <150)

## 2022-10-01 LAB — VITAMIN D, 25-OH TOTAL: Vitamin D, 25-OH, Total: 43 ng/mL (ref 30–100)

## 2022-10-01 LAB — GLYCOSYLATED HGB(A1C), BLOOD: Hgb A1C: 5.6 % of total Hgb (ref <5.7)

## 2022-10-01 LAB — NON HDL CHOLESTEROL -QUEST: Non-HDL Cholesterol: 151 mg/dL (calc) — ABNORMAL HIGH (ref <130)

## 2022-10-01 LAB — HDL-CHOLESTEROL, BLOOD: HDL Cholesterol: 71 mg/dL (ref >=50)

## 2022-10-10 NOTE — Result Encounter Note (Signed)
Your cholesterol is high but similar compared to prior levels. Continue therapeutic lifestyle habits, including regular exercise and consumption of vegetables. If you'd like, you may schedule a sooner appointment to discuss prescription treatment options for high cholesterol.     The 10-year ASCVD risk score (Arnett DK, et al., 2019) is: 7.6%    Values used to calculate the score:      Age: 67 years      Sex: Female      Is Non-Hispanic African American: No      Diabetic: No      Tobacco smoker: No      Systolic Blood Pressure: 118 mmHg      Is BP treated: Yes      HDL Cholesterol: 71 mg/dL      Total Cholesterol: 222 mg/dL    **04-VWUJ risk for ASCVD is categorized as:  Low-risk (<5%)  Borderline risk (5% to 7.4%)  Intermediate risk (7.5% to 19.9%)  High risk (=20%)       Your other lab tests have been reviewed in detail and are overall stable. There are no concerning findings. Continue taking your current medications and supplements. If you have any additional questions or concerns, please schedule a follow up appointment with our clinic for further evaluation and treatment as appropriate.            Take good care,  Dr. Isaias Cowman

## 2023-01-07 ENCOUNTER — Other Ambulatory Visit (INDEPENDENT_AMBULATORY_CARE_PROVIDER_SITE_OTHER): Payer: Self-pay

## 2023-01-07 DIAGNOSIS — I10 Essential (primary) hypertension: Secondary | ICD-10-CM

## 2023-01-30 ENCOUNTER — Telehealth (INDEPENDENT_AMBULATORY_CARE_PROVIDER_SITE_OTHER)

## 2023-01-30 VITALS — Wt 170.0 lb

## 2023-01-30 MED ORDER — FLUTICASONE-SALMETEROL 100-50 MCG/ACT IN AEPB
1.0000 | INHALATION_SPRAY | Freq: Two times a day (BID) | RESPIRATORY_TRACT | 1 refills | Status: AC
Start: 2023-01-30 — End: ?

## 2023-01-30 MED ORDER — METHYLPREDNISOLONE 4 MG OR KIT
ORAL_TABLET | ORAL | 0 refills | Status: DC
Start: 2023-01-30 — End: 2023-04-11

## 2023-01-30 MED ORDER — ALBUTEROL SULFATE 108 (90 BASE) MCG/ACT IN AERS
2.0000 | INHALATION_SPRAY | Freq: Four times a day (QID) | RESPIRATORY_TRACT | 3 refills | Status: AC | PRN
Start: 2023-01-30 — End: ?

## 2023-01-30 MED ORDER — AZITHROMYCIN 250 MG OR TABS
ORAL_TABLET | ORAL | 0 refills | Status: AC
Start: 2023-01-30 — End: 2023-02-04

## 2023-01-30 NOTE — Assessment & Plan Note (Addendum)
 Stable  Refills for inhalers - advised Advair twice per day while symptoms are flaring, albuterol  as needed   Avoid triggers when possible  Advised an air purifier in the home and frequent cleaning of linens and dust to minimize allergens   Return To Clinic if develop increased albuterol  inhaler use, nighttime awakenings, or new limitations in activity  ER precautions given if acute exacerbation with severe shortness of breath  Follow up yearly or as needed  Continue to Monitor

## 2023-01-30 NOTE — Progress Notes (Signed)
 [facility]  [place] - [place]-  [place] - [place] - [place]   [email or web address] and [email or web address]  Telephone: [phone]                 Encounter Date:  01/30/23  10:20 AM   PCP: Lionell Charlie Camellia Lynwood   MRN: [id number]  DOB: [birth year]    [facility] TELEPHONE SERVICES ENCOUNTER  Pandemic Response Ambulatory Protocol    Evaluator(s):   Yoshino Broccoli is a 68 year old female  who was evaluated by: Attending Physician (via telemedicine)    Statement:    A Relevant History (including allergies, medications, past medical history, relevant review of systems) and relevant exam as performed by the named provider, are as transcribed in this and/or the accompanying note. Please also see the patient questionnaire for details.    Patient Verification & Telemedicine Consent:      -I have verified that the patient's identification to be correct via verbal confirmation of birth date and address & valid: Yes    -The patient, and / or surrogate, has been made aware that patient is to be evaluated today using a home telemedicine visit technique (audio transmission): Yes    - The patient, and / or surrogate, has been made aware that patient has the right to refuse this type of evaluation at any time during the assessment period, and has been made aware of any alternatives to this type of evaluation: Yes    -The patient, and / or surrogate,  has been made aware that patient may need further evaluations in the future: Yes    -The patient, and / or surrogate, has signed a valid Informed Consent document (detailing risks, benefits, alternatives & costs), or is exempt from these requirements by law, which I verify is currently present in the  [place] MEDICAL RECORD [id number]                   HPI:  Debbie Hudson is a 68 year old female presents at Home for the following:   Chief Complaint   Patient presents with    Sinus Problem    Cough    Fatigue     Patient presents via telehealth to  discuss sinus pain/pressure for a few weeks. She reports the sinus pain is above her eyes and on her cheeks. She reports Elzina Devera mucus. She reports a cough and fatigue as well. She had a fever at the beginning of her illness but that resolved.   She reports she uses her albuterol  inhaler for the cough and only needs it when she is sick. She uses flonase  daily.  She needs refills on albuterol  and advar inhalers.  She states she gets recurrent sinus infections and often takes azithromycin  due to penicillin allergy but also often needs a steroid.    PROBLEM  LIST:  Patient Active Problem List   Diagnosis    Anxiety    Herpes zoster without complication    Mass of left foot    Recurrent sinusitis    H/O colonoscopy    Family history of malignant neoplasm of colon    Tendinitis of wrist    Osteoarthrosis    Carpal tunnel syndrome    Allergic rhinitis    Anaclitic depression    Fatigue, unspecified type    Food intolerance    Weight gain    Mild intermittent asthma with exacerbation    Major depressive disorder, recurrent episode, mild (CMS-HCC)  Trigger finger of right hand    Heart palpitations    Inflamed seborrheic keratosis    Back spasm    Neck muscle spasm    Pain of left calf    Viral syndrome    Hypertension, unspecified type    Dizziness    Encounter for Medicare annual wellness exam    Hyperlipidemia, unspecified hyperlipidemia type    Snoring    Chronic bilateral thoracic back pain    Neck pain    Numerous skin moles    Encounter for screening mammogram for malignant neoplasm of breast    Postmenopausal    Encounter for herb and vitamin supplement management    Skin cancer screening    Overweight with body mass index (BMI) of 28 to 28.9 in adult    Upper respiratory tract infection, unspecified type    Subacute cough    Medicare annual wellness visit, subsequent    Encounter for colorectal cancer screening    History of basal cell carcinoma (BCC)    Conjunctivitis of left eye, unspecified conjunctivitis type     Actinic keratoses    Ear itching    Constipation, unspecified constipation type    Osteopenia, unspecified location    Androgenetic alopecia    Impaired fasting glucose    Discomfort of ear, unspecified laterality         CURRENT  MEDICATIONS:  Current Outpatient Medications on File Prior to Visit   Medication Sig Dispense Refill    [DISCONTINUED] albuterol  108 (90 Base) MCG/ACT inhaler Inhale 2 puffs by mouth every 6 hours as needed for Wheezing. 6.7 g 3    ALPRAZolam  (XANAX ) 0.25 MG tablet Take 1 tablet (0.25 mg) by mouth nightly as needed for Anxiety. 5 tablet 0    cetirizine  (ZYRTEC ) 10 MG tablet Take 1 tablet (10 mg) by mouth daily. 90 tablet 1    fluticasone  propionate (FLONASE ) 50 MCG/ACT nasal spray Spray 1 spray into each nostril daily.      [DISCONTINUED] fluticasone -salmeterol (ADVAIR) 100-50 MCG/ACT inhaler Inhale 1 puff by mouth every 12 hours. 180 each 1    lisinopril  (PRINIVIL , ZESTRIL ) 5 MG tablet TAKE 1 TABLET(5 MG) BY MOUTH DAILY 90 tablet 2    tizanidine  (ZANAFLEX ) 2 MG tablet tizanidine  2 mg tablet   PRN       No current facility-administered medications on file prior to visit.     Outpatient Medications Marked as Taking for the 01/30/23 encounter [facility] Tele[facility]) with Landy Kaufmann, NP   Medication Sig Dispense Refill    albuterol  108 (90 Base) MCG/ACT inhaler Inhale 2 puffs by mouth every 6 hours as needed for Wheezing. 6.7 g 3    fluticasone -salmeterol (ADVAIR) 100-50 MCG/ACT inhaler Inhale 1 puff by mouth every 12 hours. 180 each 1        ALLERGIES:    Allergies   Allergen Reactions    Genecof-Xp Hives    Penicillin G Rash    Penicillins Rash    Dilaudid  [Hydromorphone Hcl] Itching    Hydrocodone Itching    Venlafaxine Unspecified     Felt weird          REVIEW OF SYSTEMS:  Review of Systems   Constitutional:  Positive for fatigue. Negative for fever.   HENT:  Positive for congestion, sinus pressure and sinus pain. Negative for drooling and sore throat.    Respiratory:   Positive for cough and wheezing. Negative for shortness of breath.    Cardiovascular:  Negative for  chest pain, palpitations and leg swelling.   Gastrointestinal:  Negative for abdominal pain.   Musculoskeletal:  Negative for arthralgias.   Skin:  Negative for rash.   Neurological:  Negative for headaches.                PHYSICAL EXAM:    There were no vitals filed for this visit.  Body mass index is 29.18 kg/m.    Ht Readings from Last 1 Encounters:   09/27/22 5' 4 (1.626 m)     Wt Readings from Last 1 Encounters:   01/30/23 77.1 kg (170 lb)       General Impression: No physical exam done. Sounds healthy, alert, no distress, pleasant affect, cooperative, communicative. Speech within normal limits.              ASSESSMENT & PLAN:    Minsa Weddington was seen today for sinus problem, cough and fatigue.    Diagnoses and all orders for this visit:    Recurrent sinusitis  Assessment & Plan:  Stable  complete azithromycin  course  See asthma plan   Discussed Over the Counter (OTC) medications for symptom management: Tylenol for fever reduction, NSAIDs for body aches, Mucinex for cough, Flonase  and nasal saline for congestion  Return To Clinic if no improvement   Continue to Monitor     Orders:  -     azithromycin  (ZITHROMAX ) 250 MG tablet; Take 2 tablets (500 mg) by mouth daily for 1 day, THEN 1 tablet (250 mg) daily for 4 days.    Mild intermittent asthma with exacerbation  Assessment & Plan:  Stable  Refills for inhalers - advised Advair twice per day while symptoms are flaring, albuterol  as needed   Avoid triggers when possible  Advised an air purifier in the home and frequent cleaning of linens and dust to minimize allergens   Return To Clinic if develop increased albuterol  inhaler use, nighttime awakenings, or new limitations in activity  ER precautions given if acute exacerbation with severe shortness of breath  Follow up yearly or as needed  Continue to Monitor     Orders:  -     methylPREDNISolone  (MEDROL  DOSEPACK) 4  MG tablet; Take as directed on package  -     fluticasone -salmeterol (ADVAIR) 100-50 MCG/ACT inhaler; Inhale 1 puff by mouth every 12 hours.  -     albuterol  108 (90 Base) MCG/ACT inhaler; Inhale 2 puffs by mouth every 6 hours as needed for Wheezing.          ICD-10-CM ICD-9-CM    1. Recurrent sinusitis  J32.9 473.9 azithromycin  (ZITHROMAX ) 250 MG tablet      2. Mild intermittent asthma with exacerbation  J45.21 493.92 methylPREDNISolone  (MEDROL  DOSEPACK) 4 MG tablet      fluticasone -salmeterol (ADVAIR) 100-50 MCG/ACT inhaler      albuterol  108 (90 Base) MCG/ACT inhaler          Patient instructions:  See EPIC instructions.  Reviewed verbally and/or AVS available via MYchart for patient.      Barriers to learning assessed: None.    Patient/family verbalizes understanding and is agreeable to above plan.    Patient was evaluated by Marry Seip, NP at [facility]       TIME SPENT ON MEDICAL DISCUSSION INCLUDING BEFORE, DURING, AND AFTER PHONE CONVERSATION: 20  Start time: 1020  Stop time: 48      [facility]  [email or web address]         Electronically reviewed and signed  by Marry Seip, NP

## 2023-01-30 NOTE — Assessment & Plan Note (Signed)
 Stable  complete azithromycin  course  See asthma plan   Discussed Over the Counter (OTC) medications for symptom management: Tylenol for fever reduction, NSAIDs for body aches, Mucinex for cough, Flonase  and nasal saline for congestion  Return To Clinic if no improvement   Continue to Monitor

## 2023-04-11 ENCOUNTER — Ambulatory Visit (INDEPENDENT_AMBULATORY_CARE_PROVIDER_SITE_OTHER)

## 2023-04-11 VITALS — BP 110/68 | HR 64 | Temp 97.9°F | Resp 16 | Ht 64.0 in | Wt 167.0 lb

## 2023-04-11 MED ORDER — PHENTERMINE HCL 15 MG OR CAPS
15.0000 mg | ORAL_CAPSULE | Freq: Every morning | ORAL | 0 refills | Status: DC
Start: 2023-04-11 — End: 2023-05-16

## 2023-04-11 NOTE — Progress Notes (Signed)
 [place]-  [place] - [place] - [place]   [email or web address]  [email or web address]   Telephone: [phone]      CONCERN     Encounter Date:  04/11/23  9:45 AM   PCP: Lionell Charlie Camellia Lynwood  MRN: [id number]   DOB: [birth year]     CONCERN:   Debbie Hudson is a 68 year old female presents   Chief Complaint   Patient presents with    Weight Management     Pt concerned for weight gain and fatigue x 1 month. Pt states that she has recently started eating clean and has had opposite effect     HPI    Patient presents for follow up on: weight gain, fatigue   Patient notes she's been trying to lose weight for the past 3-4 weeks  Has lost 2 lbs so far - she feels this is less than previously   Notes feeling more bloated as well     Working with a trainer  Has been exercising 5 days weekly - strength training x 1 hr, 5-20 lb weights + treadmill x 20-25 min  Was not previously exercising for the past year prior to this    24 hour recall:  Coffee with MCT powder, 2 tablespoons of half and half   Protein shake - 34 g protein, mixed with water   Salad with protein (tuna, chicken) + fruit, apple cide vinegar + olive oil   Salmon + veggie + rice/quinoa/sweet potato   Supplement drinks - 30 cal    She feels frustrated by lack of progress  Had an episode of dizziness yesterday   Ordered a blood pressure cuff - 106 systolic this morning   Did not take lisinopril  today         04/11/2023     9:22 AM 01/30/2023    10:16 AM 09/27/2022    10:41 AM 05/23/2022     8:21 AM 04/04/2022     3:10 PM 08/16/2021    10:33 AM   Date Weight Recorded   Metric 75.751 kg 77.111 kg 76.658 kg 72.576 kg 75.297 kg 75.751 kg   Pounds/Ounces 167 lb 170 lb 169 lb 160 lb 166 lb 167 lb          Other chronic conditions, if discussed, are documented below in the A/P  Problem List and Family history in the EMR have been reviewed and updated as appropriate    ASSESSMENT/   PLAN       Debbie Hudson was seen today for weight management.    Diagnoses  and all orders for this visit:    Overweight with body mass index (BMI) of 28 to 28.9 in adult  Assessment & Plan:  Active  Body mass index is 28.67 kg/m.      04/11/2023     9:22 AM 01/30/2023    10:16 AM 09/27/2022    10:41 AM 05/23/2022     8:21 AM 04/04/2022     3:10 PM 08/16/2021    10:33 AM   Date Weight Recorded   Metric 75.751 kg 77.111 kg 76.658 kg 72.576 kg 75.297 kg 75.751 kg   Pounds/Ounces 167 lb 170 lb 169 lb 160 lb 166 lb 167 lb   Continue working with systems analyst  Encouraged adherence to regimen  Reinforced patient's goals for increased activity/weight management - improved mobility, bone health, control of chronic conditions   Discussed non scale victories and increases in muscle mass   Counseled regarding proper  exercise and diet for weight loss and management  Discussed dietary recommendations such as higher protein, low carbohydrate diet  Recommend well-balanced and nutritious meals with modest portion sizes; avoid excess processed food or fast food  Drink plenty of water each day, and avoid beverages that are high in sugar or artificial sweeteners.   Goal of 150 minutes of exercise each week   Adequate hours of sleep - 6-8 hours/night.  Start phentermine  15 mg orally by mouth daily as needed to assist with weight loss  Follow up in 1-3 months as needed   Continue to Monitor       Orders:  -     phentermine  15 MG capsule; Take 1 capsule (15 mg) by mouth every morning.    Hypertension, unspecified type  Assessment & Plan:  Stable  Discussed trial off medication due to good control  Advised home monitoring twice per day while stopping medication  Keep Lisinopril  5 mg on hand in case of need to restart  Blood Pressure   04/11/23 110/68   09/27/22 118/72   05/23/22 120/80      Recommend healthy diet with exercise  DASH diet advised.  Reduce dietary sodium to no more than 2000 mg/day.  Limit alcohol to no more than 2 drinks (ex 24 oz of beer, 10 oz of wine, or 3 oz of liquor) per day in most men and to  no more than 1 drink per day in women  Tobacco cessation advised if applicable  Increase fluid intake. Reduce caffeine intake.   Stress reduction advised.   Discussed goal BP < 140/90 if low risk or <130/80 if high risk  F/u as directed.- 6 months        Other orders  -     CURES Review Documentation - I Reviewed CURES          ICD-10-CM ICD-9-CM    1. Overweight with body mass index (BMI) of 28 to 28.9 in adult  E66.3 278.02 phentermine  15 MG capsule    Z68.28 V85.24       2. Hypertension, unspecified type  I10 401.9               SUPPORTING OBJECTIVE  / SUBJECTIVE     PHYSICAL EXAM:   04/11/23  0922   BP: 110/68   Pulse: 64   Temp: 97.9 F (36.6 C)   Resp: 16   SpO2: 95%     Ht Readings from Last 1 Encounters:   04/11/23 5' 4 (1.626 m)     Wt Readings from Last 1 Encounters:   04/11/23 75.8 kg (167 lb)     Body mass index is 28.67 kg/m.        04/11/2023     9:22 AM 01/30/2023    10:16 AM 09/27/2022    10:41 AM 05/23/2022     8:21 AM 04/04/2022     3:10 PM 08/16/2021    10:33 AM   Date Weight Recorded   Metric 75.751 kg 77.111 kg 76.658 kg 72.576 kg 75.297 kg 75.751 kg   Pounds/Ounces 167 lb 170 lb 169 lb 160 lb 166 lb 167 lb        Physical Exam  Constitutional:       Appearance: Normal appearance.   HENT:      Head: Normocephalic.   Eyes:      Extraocular Movements: Extraocular movements intact.      Conjunctiva/sclera: Conjunctivae normal.      Pupils: Pupils  are equal, round, and reactive to light.   Cardiovascular:      Rate and Rhythm: Normal rate and regular rhythm.      Heart sounds: Normal heart sounds.   Pulmonary:      Effort: Pulmonary effort is normal.      Breath sounds: Normal breath sounds.   Abdominal:      General: Abdomen is flat. Bowel sounds are normal.      Palpations: Abdomen is soft.   Musculoskeletal:         General: Normal range of motion.   Neurological:      General: No focal deficit present.      Mental Status: She is alert and oriented to person, place, and time.   Psychiatric:          Mood and Affect: Mood normal.           ALLERGY / MEDICATIONS / SURGICAL  HISTORY /  SOCIAL HISTORY / IMMUNICATIONS / ROS     ALLERGIES:  Allergies   Allergen Reactions    Genecof-Xp Hives    Penicillin G Rash    Penicillins Rash    Dilaudid  [Hydromorphone Hcl] Itching    Hydrocodone Itching    Venlafaxine Unspecified     Felt weird          CURRENT  MEDICATIONS:  Outpatient Medications Marked as Taking for the 04/11/23 encounter (Office Visit) with Madelyn Millman   Medication Sig Dispense Refill    albuterol  108 (90 Base) MCG/ACT inhaler Inhale 2 puffs by mouth every 6 hours as needed for Wheezing. 6.7 g 3    ALPRAZolam  (XANAX ) 0.25 MG tablet Take 1 tablet (0.25 mg) by mouth nightly as needed for Anxiety. 5 tablet 0    cetirizine  (ZYRTEC ) 10 MG tablet Take 1 tablet (10 mg) by mouth daily. 90 tablet 1    fluticasone  propionate (FLONASE ) 50 MCG/ACT nasal spray Spray 1 spray into each nostril daily.      fluticasone -salmeterol (ADVAIR) 100-50 MCG/ACT inhaler Inhale 1 puff by mouth every 12 hours. 180 each 1    lisinopril  (PRINIVIL , ZESTRIL ) 5 MG tablet TAKE 1 TABLET(5 MG) BY MOUTH DAILY 90 tablet 2    tizanidine  (ZANAFLEX ) 2 MG tablet tizanidine  2 mg tablet   PRN            PAST SURGICAL HISTORY:  Past Surgical History:   Procedure Laterality Date    SINUS SURGERY  04/17/2017    COLONOSCOPY  11/24/2016    repeat in 5 years    ADENOIDECTOMY  1962    ANTERIOR CRUCIATE LIGAMENT REPAIR Left     CARPAL TUNNEL RELEASE Bilateral     CESAREAN SECTION, CLASSIC  1984, 1986    TONSILLECTOMY AND ADENOIDECTOMY          SOCIAL HISTORY:  Socioeconomic History    Marital status: Married    Number of children: 2    Years of education: 2   Occupational History    Occupation: Retired / Optometrist   Tobacco Use    Smoking status: Never    Smokeless tobacco: Never   Substance and Sexual Activity    Alcohol use: Not Currently     Comment: occ    Drug use: No    Sexual activity: Not Currently     Partners: Male   Social Activities of  Daily Living Present    Military Service No    Blood Transfusions No    Caffeine Concern No  Occupational Exposure No    Hobby Hazards No    Sleep Concern Yes     Comment: snoring    Stress Concern No    Weight Concern Yes    Special Diet No    Back Care Yes     Comment: neck stiffness, rt mid back stiffness    Exercises Regularly No    Bike Helmet Use Yes    Seat Belt Use Yes    Performs Self-Exams Yes       Immunization History   Administered Date(s) Administered    (Shingles) Herpes Zoster Vaccine (ZOSTAVAX) 11/10/2011    Influenza Vaccine (Unspecified) 10/31/1996, 11/30/2007, 11/06/2009, 09/29/2010, 11/19/2013    Influenza Vaccine >=6 Months 10/23/2008, 12/19/2011, 12/10/2012, 10/10/2016    Pneumococcal 23 Vaccine (PNEUMOVAX-23) 09/29/2010    Tdap 08/31/2009         REVIEW OF SYSTEMS:   (unless listed below, the ROS  is negative except as listed in HPI)  Review of Systems            Vernell Mate PA-C  [facility]  [email or web address]

## 2023-04-11 NOTE — Assessment & Plan Note (Signed)
 Stable  Discussed trial off medication due to good control  Advised home monitoring twice per day while stopping medication  Keep Lisinopril  5 mg on hand in case of need to restart  Blood Pressure   04/11/23 110/68   09/27/22 118/72   05/23/22 120/80      Recommend healthy diet with exercise  DASH diet advised.  Reduce dietary sodium to no more than 2000 mg/day.  Limit alcohol to no more than 2 drinks (ex 24 oz of beer, 10 oz of wine, or 3 oz of liquor) per day in most men and to no more than 1 drink per day in women  Tobacco cessation advised if applicable  Increase fluid intake. Reduce caffeine intake.   Stress reduction advised.   Discussed goal BP < 140/90 if low risk or <130/80 if high risk  F/u as directed.- 6 months

## 2023-04-11 NOTE — Assessment & Plan Note (Addendum)
 Active  Body mass index is 28.67 kg/m.      04/11/2023     9:22 AM 01/30/2023    10:16 AM 09/27/2022    10:41 AM 05/23/2022     8:21 AM 04/04/2022     3:10 PM 08/16/2021    10:33 AM   Date Weight Recorded   Metric 75.751 kg 77.111 kg 76.658 kg 72.576 kg 75.297 kg 75.751 kg   Pounds/Ounces 167 lb 170 lb 169 lb 160 lb 166 lb 167 lb   Continue working with systems analyst  Encouraged adherence to regimen  Reinforced patient's goals for increased activity/weight management - improved mobility, bone health, control of chronic conditions   Discussed non scale victories and increases in muscle mass   Counseled regarding proper exercise and diet for weight loss and management  Discussed dietary recommendations such as higher protein, low carbohydrate diet  Recommend well-balanced and nutritious meals with modest portion sizes; avoid excess processed food or fast food  Drink plenty of water each day, and avoid beverages that are high in sugar or artificial sweeteners.   Goal of 150 minutes of exercise each week   Adequate hours of sleep - 6-8 hours/night.  Start phentermine  15 mg orally by mouth daily as needed to assist with weight loss  Follow up in 1-3 months as needed   Continue to Monitor

## 2023-05-16 ENCOUNTER — Other Ambulatory Visit (INDEPENDENT_AMBULATORY_CARE_PROVIDER_SITE_OTHER): Payer: Self-pay | Admitting: Medical

## 2023-05-16 ENCOUNTER — Ambulatory Visit (INDEPENDENT_AMBULATORY_CARE_PROVIDER_SITE_OTHER): Admitting: Family Medicine

## 2023-05-16 VITALS — BP 132/72 | HR 65 | Temp 97.0°F | Resp 16 | Ht 63.5 in | Wt 169.0 lb

## 2023-05-16 DIAGNOSIS — J309 Allergic rhinitis, unspecified: Secondary | ICD-10-CM

## 2023-05-16 NOTE — Assessment & Plan Note (Signed)
 Active   Avoid strenous activities  Exercise and stretch regularly, take over the counter pain medications prn, sleep in a comfortable position and avoid sitting for long periods  Recommend heat and ice compressions   Consider physical therapy, referral input   Complete x-ray as ordered, follow up for results   Continue to monitor

## 2023-05-16 NOTE — Assessment & Plan Note (Signed)
 Stable   Recommend supplementing with 1000 mcg over the counter daily  Recommend updating B12 labs yearly   Continue to monitor

## 2023-05-16 NOTE — Assessment & Plan Note (Signed)
 Active   Tried and stopped pherntermine   Eat small, frequent meals, no carbs after 6 pm unless just working out, protein w/ every meal, good sources- 100% whey protein powder, egg whites, beef jerky, cottage cheese, tuna, avoid white breads and sugars, eat complex carbs, fresh fruits and veggies  Discussed the need for increase in Exercise and improvement of diet  Food Journal advised  Recommend Obesity Code by Zeb Heys, MD  Recommend How to Lose Weight for the Last Time: Brain-Based Solutions for Permanent Weight Loss by Rene Carrier, MD  Continue to monitor

## 2023-05-16 NOTE — Assessment & Plan Note (Addendum)
 Active  Labs as ordered, follow up pending results   Continue on current medication regimen, will adjust dosages if indicated by lab results   Avoid caffeine, high-fat foods, spicy foods, dairy, alcohol, and sodas  Return to clinic if symptoms worsen   Continue to monitor

## 2023-05-16 NOTE — Progress Notes (Signed)
 [place] - [place]-  [place] - [place] - [place]   [email or web address] and [email or web address]  Telephone: [phone]             Encounter Date:  05/16/2023   8:30 AM PDT     PCP: Mart Skill  MRN: [id number]  DOB: Dec 15, 1955     CC: Fatigue (Pt presents to clinic with c/o fatigue and joint ache.)       HPI:  Debbie Hudson is a 68 year old female presents   Chief Complaint   Patient presents with    Fatigue     Pt presents to clinic with c/o fatigue and joint ache.       Patient is followed by the following specialist(s):  Sleep medicine -  Dr Alfreida Anon, last appointment 2022     Equipment for mobility:  None     Fatigue/sleep disorder breathing   Patient reports persistent fatigue throughout the day  Sleeping 7-9 hours on average   Reports to intermittent nocturia  Completed sleep study in 2022, admits to snoring but was not diagnosed with sleep apnea, had sleep disorder breathing   Patient uses Flonase  twice a day, endorses headaches in the morning     Overweight  Patient is overweight   She tried and did not complete Phentermine , did not notice improvement, felt "placebo" effect   She has been consuming a cleaner diet and keeping active with a physical trainer      05/16/2023     8:32 AM 04/11/2023     9:22 AM 01/30/2023    10:16 AM 09/27/2022    10:41 AM 05/23/2022     8:21 AM 04/04/2022     3:10 PM   Date Weight Recorded   Metric 76.658 kg 75.751 kg 77.111 kg 76.658 kg 72.576 kg 75.297 kg   Pounds/Ounces 169 lb 167 lb 170 lb 169 lb 160 lb 166 lb         05/16/2023     8:32 AM 04/11/2023     9:22 AM 09/27/2022    10:41 AM 05/23/2022     8:21 AM 04/04/2022     3:10 PM 08/16/2021    10:33 AM   Date BMI Recorded   BMI 29.47 kg/m2 28.67 kg/m2 29.01 kg/m2 27.46 kg/m2 28.49 kg/m2 28.67 kg/m2      Arthritis pain/RLS/osteopenia   Patient reports knee, ankle, and elbow joint pain  She has a previous diagnosis of osteopenia noted on 2022 DEXA  She has tried massage and chiropractic therapy   She takes collagen    Her joint pain flared up when she started exercising more  She has been taking a Mg supplement for cramping in LEs      DEXA 06/09/2020    Left hip: T-score: -1.5    Lumbar spine T-score: -2.1     Hypertension  Patient with hypertension   Patient stopped lisinopril  due to dizziness    Patient admits to gluten sensitivity, avoid consumption due to this  Her sons and daughter have gluten intolerance, concerning for celiac     Last menstrual period ~ late 40s    Other chronic conditions, if discussed, are documented below in the A/P      PROBLEM  LIST:  Patient Active Problem List   Diagnosis    Anxiety    Herpes zoster without complication    Mass of left foot    Recurrent sinusitis    H/O colonoscopy    Family history of malignant  neoplasm of colon    Tendinitis of wrist    Arthritis pain    Carpal tunnel syndrome    Allergic rhinitis    Anaclitic depression    Fatigue, unspecified type    Food intolerance    Weight gain    Mild intermittent asthma with exacerbation    Major depressive disorder, recurrent episode, mild    Trigger finger of right hand    Heart palpitations    Inflamed seborrheic keratosis    Back spasm    Neck muscle spasm    Pain of left calf    Viral syndrome    Hypertension, unspecified type    Dizziness    Encounter for Medicare annual wellness exam    Hyperlipidemia, unspecified hyperlipidemia type    Snoring    Chronic bilateral thoracic back pain    Neck pain    Numerous skin moles    Encounter for screening mammogram for malignant neoplasm of breast    Postmenopausal    Encounter for herb and vitamin supplement management    Skin cancer screening    Overweight with body mass index (BMI) of 28 to 28.9 in adult    Upper respiratory tract infection, unspecified type    Subacute cough    Medicare annual wellness visit, subsequent    Encounter for colorectal cancer screening    History of basal cell carcinoma (BCC)    Conjunctivitis of left eye, unspecified conjunctivitis type    Actinic  keratoses    Ear itching    Constipation, unspecified constipation type    Osteopenia, unspecified location    Androgenetic alopecia    Impaired fasting glucose    Discomfort of ear, unspecified laterality    Sleep disorder breathing    Restless legs syndrome (RLS)    Non-celiac gluten sensitivity    Vitamin B12 deficiency         PAST MEDICAL HISTORY:  Past Medical History:   Diagnosis Date    Anxiety     Asthma 05/07/2021    Chest congestion 01/13/2017    Hypertension 04/07/2020    Major depressive disorder, single episode     Recurrent sinusitis 01/13/2017    Sinus pressure 12/20/2016       PAST SURGICAL HISTORY:  Past Surgical History:   Procedure Laterality Date    SINUS SURGERY  04/17/2017    COLONOSCOPY  11/24/2016    repeat in 5 years    ADENOIDECTOMY  1962    ANTERIOR CRUCIATE LIGAMENT REPAIR Left     CARPAL TUNNEL RELEASE Bilateral     CESAREAN SECTION, CLASSIC  1984, 1986    TONSILLECTOMY AND ADENOIDECTOMY          FAMILY HISTORY:   Family History   Problem Relation Name Age of Onset    Cancer Mother Elene Griffes         Lymphoma    Hypertension Mother Elene Griffes     Cancer Father Daren Eck         Colon, Chondrosarcoma    Hypertension Father Daren Eck     Cholesterol/Lipid Disorder Father Daren Eck     Hypertension Brother Corena Devon     Hypertension Brother Macarthur Savory          SOCIAL HISTORY:  none     Socioeconomic History    Marital status: Married    Number of children: 2    Years of education: 2   Occupational History    Occupation: Retired /  Nuclear Med Tech   Tobacco Use    Smoking status: Never    Smokeless tobacco: Never   Substance and Sexual Activity    Alcohol use: Not Currently     Comment: occ    Drug use: No    Sexual activity: Not Currently     Partners: Male   Other Topics Concern    Military Service No    Blood Transfusions No    Caffeine Concern No    Occupational Exposure No    Hobby Hazards No    Sleep Concern Yes     Comment: snoring    Stress Concern No     Weight Concern Yes    Special Diet No    Back Care Yes     Comment: neck stiffness, rt mid back stiffness    Exercises Regularly No    Bike Helmet Use Yes    Seat Belt Use Yes    Performs Self-Exams Yes     Social History     Tobacco Use   Smoking Status Never   Smokeless Tobacco Never      Social History     Substance and Sexual Activity   Alcohol Use Not Currently    Comment: occ      Social History     Substance and Sexual Activity   Drug Use No        Immunization History   Administered Date(s) Administered    (Shingles) Herpes Zoster Vaccine (ZOSTAVAX) 11/10/2011    Influenza Vaccine (Unspecified) 10/31/1996, 11/30/2007, 11/06/2009, 09/29/2010, 11/19/2013    Influenza Vaccine >=6 Months 10/23/2008, 12/19/2011, 12/10/2012, 10/10/2016    Pneumococcal 23 Vaccine (PNEUMOVAX-23) 09/29/2010    Tdap 08/31/2009         CURRENT  MEDICATIONS:  Current Outpatient Medications on File Prior to Visit   Medication Sig Dispense Refill    albuterol  108 (90 Base) MCG/ACT inhaler Inhale 2 puffs by mouth every 6 hours as needed for Wheezing. 6.7 g 3    ALPRAZolam  (XANAX ) 0.25 MG tablet Take 1 tablet (0.25 mg) by mouth nightly as needed for Anxiety. 5 tablet 0    cetirizine  (ZYRTEC ) 10 MG tablet Take 1 tablet (10 mg) by mouth daily. 90 tablet 1    fluticasone  propionate (FLONASE ) 50 MCG/ACT nasal spray Spray 1 spray into each nostril daily.      fluticasone -salmeterol (ADVAIR) 100-50 MCG/ACT inhaler Inhale 1 puff by mouth every 12 hours. 180 each 1    [DISCONTINUED] lisinopril  (PRINIVIL , ZESTRIL ) 5 MG tablet TAKE 1 TABLET(5 MG) BY MOUTH DAILY 90 tablet 2    [DISCONTINUED] phentermine  15 MG capsule Take 1 capsule (15 mg) by mouth every morning. 30 capsule 0    tizanidine  (ZANAFLEX ) 2 MG tablet tizanidine  2 mg tablet   PRN       No current facility-administered medications on file prior to visit.     Outpatient Medications Marked as Taking for the 05/16/23 encounter (Office Visit) with Dori Garbe, MD   Medication Sig Dispense  Refill    albuterol  108 (90 Base) MCG/ACT inhaler Inhale 2 puffs by mouth every 6 hours as needed for Wheezing. 6.7 g 3    ALPRAZolam  (XANAX ) 0.25 MG tablet Take 1 tablet (0.25 mg) by mouth nightly as needed for Anxiety. 5 tablet 0    cetirizine  (ZYRTEC ) 10 MG tablet Take 1 tablet (10 mg) by mouth daily. 90 tablet 1    fluticasone  propionate (FLONASE ) 50 MCG/ACT nasal spray Spray 1 spray into each  nostril daily.      fluticasone -salmeterol (ADVAIR) 100-50 MCG/ACT inhaler Inhale 1 puff by mouth every 12 hours. 180 each 1    tizanidine  (ZANAFLEX ) 2 MG tablet tizanidine  2 mg tablet   PRN          ALLERGIES:    Allergies   Allergen Reactions    Genecof-Xp Hives    Penicillin G Rash    Penicillins Rash    Dilaudid  [Hydromorphone Hcl] Itching    Hydrocodone Itching    Venlafaxine Unspecified     Felt weird          REVIEW OF SYSTEMS:  Review of Systems  Negative except as noted in HPI         PHYSICAL EXAM:   05/16/23  0832   BP: 132/72   Pulse: 65   Temp: 97 F (36.1 C)   Resp: 16   SpO2: 98%     Body mass index is 29.47 kg/m.      Ht Readings from Last 1 Encounters:   05/16/23 5' 3.5" (1.613 m)     Wt Readings from Last 1 Encounters:   05/16/23 76.7 kg (169 lb)         05/16/2023     8:32 AM 04/11/2023     9:22 AM 01/30/2023    10:16 AM 09/27/2022    10:41 AM 05/23/2022     8:21 AM 04/04/2022     3:10 PM   Date Weight Recorded   Metric 76.658 kg 75.751 kg 77.111 kg 76.658 kg 72.576 kg 75.297 kg   Pounds/Ounces 169 lb 167 lb 170 lb 169 lb 160 lb 166 lb            Physical Exam  Vitals and nursing note reviewed.   Constitutional:       Appearance: She is well-developed.   Cardiovascular:      Rate and Rhythm: Normal rate and regular rhythm.      Heart sounds: Normal heart sounds.   Pulmonary:      Effort: Pulmonary effort is normal.      Breath sounds: Normal breath sounds.   Skin:     General: Skin is warm and dry.   Psychiatric:         Behavior: Behavior normal.           ASSESSMENT AND PLAN       Beautifull Riesberg was seen today  for fatigue.    Diagnoses and all orders for this visit:    Sleep disorder breathing  Assessment & Plan:  Active  Noted on sleep study in 2022 from Dr. Rhae Cease   Avoiding napping in the middle of the day   Continue Mg supplement   Avoid caffeine and liquid intake 2 hours prior to bedtime  Increase physical activity   Continue to monitor        Overweight with body mass index (BMI) of 28 to 28.9 in adult  Overview:      05/16/2023     8:32 AM 04/11/2023     9:22 AM 01/30/2023    10:16 AM 09/27/2022    10:41 AM 05/23/2022     8:21 AM 04/04/2022     3:10 PM   Date Weight Recorded   Metric 76.658 kg 75.751 kg 77.111 kg 76.658 kg 72.576 kg 75.297 kg   Pounds/Ounces 169 lb 167 lb 170 lb 169 lb 160 lb 166 lb         05/16/2023  8:32 AM 04/11/2023     9:22 AM 09/27/2022    10:41 AM 05/23/2022     8:21 AM 04/04/2022     3:10 PM 08/16/2021    10:33 AM   Date BMI Recorded   BMI 29.47 kg/m2 28.67 kg/m2 29.01 kg/m2 27.46 kg/m2 28.49 kg/m2 28.67 kg/m2        Assessment & Plan:  Active   Tried and stopped pherntermine   Eat small, frequent meals, no carbs after 6 pm unless just working out, protein w/ every meal, good sources- 100% whey protein powder, egg whites, beef jerky, cottage cheese, tuna, avoid white breads and sugars, eat complex carbs, fresh fruits and veggies  Discussed the need for increase in Exercise and improvement of diet  Food Journal advised  Recommend Obesity Code by Zeb Heys, MD  Recommend How to Lose Weight for the Last Time: [employer]-Based [employer] for Permanent Weight Loss by Rene Carrier, MD  Continue to monitor        Arthritis pain  Assessment & Plan:  Stable  Recommended osteoarthritis supplements  Complete imaging as ordered and follow up pending results   Increase physical activity   Continue to monitor     Supplements for Osteoarthritis:  ? Turmeric (Curcumin) 500 mg up to 3-4 times per day (1500-2000 mg daily)  ? Fish Oil 2,000 mg to 3,000 mg daily (can take in divided doses, ie twice per day)  ? Boswellia -  Take at least 150 mg taken up to three times per day as needed  ? Ginger 25 mg per day  ? Glucosamine-Chondroitin Sulfate - take as directed on the label  ? SAM-e is also effective in lowering pain associated with arthritis. Take 400 mg 2-  3 times per day.  ? Bromelain is an enzyme extracted from pineapples. Take 500 mg 2-3 times per   day  *Consider taking 1-3 of the above to optimize results. These can be purchased at your   local pharmacy, [facility], [email or web address], [facility] or online.      OTC pharmaceuticals:  ? Tylenol 500-650 mg up to 4 times per day for pain  ? Voltaren topical gel 1% - apply 3-4 times per day to affected area  ? Aspercream apply 3-4 times per day to affected area  ? Naproxen (aleve) and ibuprofen (advil, motrin) can be helpful but those with chronic   kidney disease, heart disease, stomach issues should avoid      Orders:  -     X-Ray Thoracolumbar, 2 views  -     X-Ray Lumbosacral Spine 2 Or 3 Views    Restless legs syndrome (RLS)  Assessment & Plan:  Active.  Recommend walking, bicycling, soaking the affected limbs, and leg massage, pneumatic compression.  Consider trial of abstinence from caffeine and alcohol.  Recommend continuing a magnesium supplement.   Recommend healthy sleep hygiene.   Check labs and follow up   Continue to monitor.     Orders:  -     Iron / IBC Panel  -     Ferritin, Blood Green Plasma Separator Tube  -     X-Ray Lumbosacral Spine 2 Or 3 Views    Hyperlipidemia, unspecified hyperlipidemia type  Assessment & Plan:  Stable  Current medication: none  Complete labs as ordered and follow up pending results   Advised patient to consume a low sugar / low simple carbohydrate diet and the need to increase plant based fiber (25 grams/day min), vegetables and  whole grains to help lower cholesterol  Recommend increasing exercise and monitoring diet with goal of losing weight  Continue to monitor     Lab Results   Component Value Date    CHOL 222 (H) 09/30/2022    HDL 71 09/30/2022     LDL 161 (H) 09/30/2022    TRIG 85 09/30/2022          Orders:  -     CBC w/ Diff Lavender  -     Comprehensive Metabolic Panel  -     TSH w/ Reflex to FT4    Vitamin B12 deficiency  Assessment & Plan:  Stable   Recommend supplementing with 1000 mcg over the counter daily  Recommend updating B12 labs yearly   Continue to monitor     Orders:  -     Vitamin B12, Blood Green Plasma Separator Tube    Osteopenia, unspecified location  Overview:  DEXA 06/09/2020    Left hip: T-score: -1.5    Lumbar spine T-score: -2.1     Assessment & Plan:  Active   Noted on DEXA 06/09/2020   Supplementation with [phone] mg of calcium, 250-500 mg of magnesium advised.  Daily intake of 1,000 to 5,000 IU Vitamin D .  Advised dietary intake calcium rich foods: yogurt, cheese and milk.   Diet high in fruits and vegetables, which contain calcium, magnesium and vitamin K should be considered.   Recommend weight bearing exercises, cautiously avoiding falls or injuries.   Avoid soda, which weakens bones due to its high phosphoric acid content.   Repeat DEXA in 2027  Continue to monitor    Orders:  -     Vitamin D , 25-OH Total Yellow serum separator tube  -     X-Ray Thoracolumbar, 2 views  -     X-Ray Lumbosacral Spine 2 Or 3 Views    Non-celiac gluten sensitivity  Assessment & Plan:  Active  Labs as ordered, follow up pending results   Continue on current medication regimen, will adjust dosages if indicated by lab results   Avoid caffeine, high-fat foods, spicy foods, dairy, alcohol, and sodas  Return to clinic if symptoms worsen   Continue to monitor      Orders:  -     Ferritin, Blood Green Plasma Separator Tube    Chronic bilateral thoracic back pain  Assessment & Plan:  Active   Avoid strenous activities  Exercise and stretch regularly, take over the counter pain medications prn, sleep in a comfortable position and avoid sitting for long periods  Recommend heat and ice compressions   Consider physical therapy, referral input   Complete  x-ray as ordered, follow up for results   Continue to monitor      Orders:  -     Physical Therapy  -     X-Ray Thoracolumbar, 2 views  -     X-Ray Lumbosacral Spine 2 Or 3 Views          ICD-10-CM ICD-9-CM    1. Sleep disorder breathing  G47.30 780.59       2. Overweight with body mass index (BMI) of 28 to 28.9 in adult  E66.3 278.02     Z68.28 V85.24       3. Arthritis pain  M19.90 716.90 X-Ray Thoracolumbar, 2 views      X-Ray Lumbosacral Spine 2 Or 3 Views      4. Restless legs syndrome (RLS)  G25.81 333.94 Iron / IBC Panel  Ferritin, Blood Green Plasma Separator Tube      X-Ray Lumbosacral Spine 2 Or 3 Views      5. Hyperlipidemia, unspecified hyperlipidemia type  E78.5 272.4 CBC w/ Diff Lavender      Comprehensive Metabolic Panel      TSH w/ Reflex to FT4      6. Vitamin B12 deficiency  E53.8 266.2 Vitamin B12, Blood Green Plasma Separator Tube      7. Osteopenia, unspecified location  M85.80 733.90 Vitamin D , 25-OH Total Yellow serum separator tube      X-Ray Thoracolumbar, 2 views      X-Ray Lumbosacral Spine 2 Or 3 Views      8. Non-celiac gluten sensitivity  K90.41 V15.05 Ferritin, Blood Green Plasma Separator Tube      9. Chronic bilateral thoracic back pain  M54.6 724.1 Physical Therapy    G89.29 338.29 X-Ray Thoracolumbar, 2 views      X-Ray Lumbosacral Spine 2 Or 3 Views            By signing below, I acknowledge that I have reviewed the above note, dictated by me and scribed by Lorne Rosebush, for accuracy and edited  where necessary. The note accurately reflects the services provided at this  encounter. We retain the right to modify this information in the event of  errors. Occasional errors in punctuation, grammar and content may occur.        Electronically reviewed and signed by Wardell Guys, MD.      [facility]  [email or web address]

## 2023-05-16 NOTE — Assessment & Plan Note (Signed)
 Active.  Recommend walking, bicycling, soaking the affected limbs, and leg massage, pneumatic compression.  Consider trial of abstinence from caffeine and alcohol.  Recommend continuing a magnesium supplement.   Recommend healthy sleep hygiene.   Check labs and follow up   Continue to monitor.

## 2023-05-16 NOTE — Assessment & Plan Note (Signed)
Active   Noted on DEXA 06/09/2020   Supplementation with [phone] mg of calcium, 250-500 mg of magnesium advised.  Daily intake of 1,000 to 5,000 IU Vitamin D.  Advised dietary intake calcium rich foods: yogurt, cheese and milk.   Diet high in fruits and vegetables, which contain calcium, magnesium and vitamin K should be considered.   Recommend weight bearing exercises, cautiously avoiding falls or injuries.   Avoid soda, which weakens bones due to its high phosphoric acid content.   Repeat DEXA in 2027  Continue to monitor.

## 2023-05-16 NOTE — Assessment & Plan Note (Signed)
 Stable  Recommended osteoarthritis supplements  Complete imaging as ordered and follow up pending results   Increase physical activity   Continue to monitor     Supplements for Osteoarthritis:  ? Turmeric (Curcumin) 500 mg up to 3-4 times per day (1500-2000 mg daily)  ? Fish Oil 2,000 mg to 3,000 mg daily (can take in divided doses, ie twice per day)  ? Boswellia - Take at least 150 mg taken up to three times per day as needed  ? Ginger 25 mg per day  ? Glucosamine-Chondroitin Sulfate - take as directed on the label  ? SAM-e is also effective in lowering pain associated with arthritis. Take 400 mg 2-  3 times per day.  ? Bromelain is an enzyme extracted from pineapples. Take 500 mg 2-3 times per   day  *Consider taking 1-3 of the above to optimize results. These can be purchased at your   local pharmacy, [facility], [email or web address], [facility] or online.      OTC pharmaceuticals:  ? Tylenol 500-650 mg up to 4 times per day for pain  ? Voltaren topical gel 1% - apply 3-4 times per day to affected area  ? Aspercream apply 3-4 times per day to affected area  ? Naproxen (aleve) and ibuprofen (advil, motrin) can be helpful but those with chronic   kidney disease, heart disease, stomach issues should avoid

## 2023-05-16 NOTE — Assessment & Plan Note (Signed)
 Active  Noted on sleep study in 2022 from Dr. Rhae Cease   Avoiding napping in the middle of the day   Continue Mg supplement   Avoid caffeine and liquid intake 2 hours prior to bedtime  Increase physical activity   Continue to monitor

## 2023-05-16 NOTE — Patient Instructions (Signed)
 05/16/2023     8:32 AM 04/11/2023     9:22 AM 01/30/2023    10:16 AM 09/27/2022    10:41 AM 05/23/2022     8:21 AM 04/04/2022     3:10 PM   Date Weight Recorded   Metric 76.658 kg 75.751 kg 77.111 kg 76.658 kg 72.576 kg 75.297 kg   Pounds/Ounces 169 lb 167 lb 170 lb 169 lb 160 lb 166 lb

## 2023-05-16 NOTE — Assessment & Plan Note (Signed)
 Stable  Current medication: none  Complete labs as ordered and follow up pending results   Advised patient to consume a low sugar / low simple carbohydrate diet and the need to increase plant based fiber (25 grams/day min), vegetables and whole grains to help lower cholesterol  Recommend increasing exercise and monitoring diet with goal of losing weight  Continue to monitor     Lab Results   Component Value Date    CHOL 222 (H) 09/30/2022    HDL 71 09/30/2022    LDL 133 (H) 09/30/2022    TRIG 85 09/30/2022

## 2023-05-17 LAB — COMPREHENSIVE METABOLIC PANEL, BLOOD
ALT (SGPT): 23 U/L (ref 6–29)
AST (SGOT): 23 U/L (ref 10–35)
Albumin/Glob Ratio: 1.9 (calc) (ref 1.0–2.5)
Albumin: 4.4 g/dL (ref 3.6–5.1)
Alkaline Phos: 80 U/L (ref 37–153)
BUN: 16 mg/dL (ref 7–25)
Bilirubin, Total: 0.6 mg/dL (ref 0.2–1.2)
Calcium: 9.5 mg/dL (ref 8.6–10.4)
Carbon Dioxide: 27 mmol/L (ref 20–32)
Chloride: 106 mmol/L (ref 98–110)
Creatinine: 0.72 mg/dL (ref 0.50–1.05)
EGFR: 91 mL/min/{1.73_m2} (ref >=60)
Globulin: 2.3 g/dL (ref 1.9–3.7)
Glucose: 97 mg/dL (ref 65–99)
Potassium: 4.2 mmol/L (ref 3.5–5.3)
Sodium: 141 mmol/L (ref 135–146)
Total Protein: 6.7 g/dL (ref 6.1–8.1)

## 2023-05-17 LAB — CBC WITH DIFF, BLOOD
Abs Basophils: 52 {cells}/uL (ref 0–200)
Abs Eosinophils: 122 {cells}/uL (ref 15–500)
Abs Lymphs: 1995 {cells}/uL (ref 850–3900)
Abs Monocytes: 418 {cells}/uL (ref 200–950)
Abs Neutrophils: 3213 {cells}/uL (ref 1500–7800)
Basophils: 0.9 %
Eosinophils: 2.1 %
HCT: 44 % (ref 35.0–45.0)
HGB: 14.3 g/dL (ref 11.7–15.5)
Lymps: 34.4 %
MCH: 28 pg (ref 27.0–33.0)
MCHC: 32.5 g/dL (ref 32.0–36.0)
MCV: 86.3 fL (ref 80.0–100.0)
MPV: 10.9 fL (ref 7.5–12.5)
Monocytes: 7.2 %
PLT: 240 10*3/uL (ref 140–400)
RBC: 5.1 10*6/uL (ref 3.80–5.10)
RDW: 12.2 % (ref 11.0–15.0)
SEGS: 55.4 %
WBC: 5.8 10*3/uL (ref 3.8–10.8)

## 2023-05-17 LAB — IRON/IBC PANEL
IIBC: 371 ug/dL (ref 250–450)
Iron Saturation: 25 % (ref 16–45)
Iron: 93 ug/dL (ref 45–160)

## 2023-05-17 LAB — VITAMIN B12, BLOOD: Vitamin B12: 964 pg/mL (ref 200–1100)

## 2023-05-17 LAB — VITAMIN D, 25-OH TOTAL: Vitamin D, 25-OH, Total: 49 ng/mL (ref 30–100)

## 2023-05-17 LAB — TSH W/REFLEX TO FT4-QUEST: TSH: 1.68 m[IU]/L (ref 0.40–4.50)

## 2023-05-17 LAB — FERRITIN, BLOOD: Ferritin: 38 ng/mL (ref 16–288)

## 2023-05-24 ENCOUNTER — Telehealth (INDEPENDENT_AMBULATORY_CARE_PROVIDER_SITE_OTHER): Payer: Self-pay | Admitting: Family Medicine

## 2023-05-24 NOTE — Telephone Encounter (Addendum)
 Left detailed voicemail. Consent on file.      ----- Message from Raechel M sent at 05/22/2023  2:20 PM PDT -----  Normal iron levels   No evidence of acute infection or anemia   Normal thyroid function     Recent labs did not show any evidence of T2DM   Normal kidney function   Normal liver function     Normal B12 and vit D levels

## 2023-05-29 ENCOUNTER — Encounter (INDEPENDENT_AMBULATORY_CARE_PROVIDER_SITE_OTHER): Payer: Self-pay | Admitting: Family

## 2023-10-21 ENCOUNTER — Ambulatory Visit (INDEPENDENT_AMBULATORY_CARE_PROVIDER_SITE_OTHER): Admitting: Physician Assistant

## 2023-10-21 ENCOUNTER — Encounter (INDEPENDENT_AMBULATORY_CARE_PROVIDER_SITE_OTHER): Payer: Self-pay | Admitting: Physician Assistant

## 2023-10-21 VITALS — BP 122/80 | HR 83 | Temp 98.3°F | Resp 18 | Ht 63.0 in | Wt 170.0 lb

## 2023-10-21 MED ORDER — POLYMYXIN B-TRIMETHOPRIM 10000-0.1 UNIT/ML-% OP SOLN
1.0000 [drp] | Freq: Four times a day (QID) | OPHTHALMIC | 0 refills | Status: AC
Start: 2023-10-21 — End: 2023-10-30

## 2023-10-21 NOTE — Progress Notes (Signed)
 Subjective:   [name] is a 68 year old female who is here for Irritation (Ep 38y F presents to clinic rt eye irritation x4 dys. Pt states Wednesday this week her rt eye started itching accompanied by swelling and redness. Pt states she is having discharge from the eye./Eric A Espinoza/)      HPI     See CC   In no acute distress O/W well appearing     Review of Systems   Constitutional:  Negative for chills and fever.   Eyes:  Positive for discharge, redness and itching. Negative for photophobia, pain and visual disturbance.   All other systems reviewed and are negative.       ROS negative other than what is stated in HPI      albuterol  Inhale 2 puffs by mouth every 6 hours as needed for Wheezing. 6.7 g 3    ALPRAZolam  Take 1 tablet (0.25 mg) by mouth nightly as needed for Anxiety. 5 tablet 0    cetirizine  TAKE 1 TABLET(10 MG) BY MOUTH DAILY 90 tablet 1    fluticasone  propionate Spray 1 spray into each nostril daily.      fluticasone -salmeterol Inhale 1 puff by mouth every 12 hours. 180 each 1    polymyxin b-trimethoprim  Place 1 drop into both eyes 4 times daily for 7 days. 10 mL 0    tiZANidine  tizanidine  2 mg tablet   PRN       Allergies[1]    Reviewed patients pertinent information related to social history, past medical, past surgical, and family history.     Objective:  Vital signs: BP 122/80 (BP Location: Left arm, BP Patient Position: Sitting, BP cuff size: Regular)   Pulse 83   Temp 98.3 F (36.8 C) (Temporal)   Resp 18   Ht 5' 3 (1.6 m)   Wt 77.1 kg (170 lb)   SpO2 98%   BMI 30.11 kg/m     Physical Exam  Vitals and nursing note reviewed.   Constitutional:       General: She is not in acute distress.     Appearance: Normal appearance. She is well-developed. She is not ill-appearing, toxic-appearing or diaphoretic.   Eyes:      General: No scleral icterus.        Right eye: Discharge present.         Left eye: No discharge.      Comments: Erythematous right conjunctiva   Cardiovascular:       Rate and Rhythm: Normal rate.   Pulmonary:      Effort: Pulmonary effort is normal.   Neurological:      Mental Status: She is alert and oriented to person, place, and time.      Cranial Nerves: No dysarthria or facial asymmetry.   Psychiatric:         Behavior: Behavior normal.         Thought Content: Thought content normal.         Judgment: Judgment normal.           Assessment/Plan:  [name] was seen today for irritation.    Diagnoses and all orders for this visit:    Bacterial conjunctivitis  -     polymyxin b-trimethoprim  (POLYTRIM) 10000-0.1 UNIT/ML-% ophthalmic solution; Place 1 drop into both eyes 4 times daily for 7 days.       Assessment and Plan  Diagnosis:  Bacterial Conjunctivitis - Active  Patient presents with symptoms consistent with bacterial  conjunctivitis, including eye redness, purulent discharge, mild irritation, and no visual changes. No evidence of corneal involvement, contact lens-related complications, or systemic illness.    Differential Diagnosis (ranked most to least serious):  Orbital cellulitis  Keratitis or corneal ulcer  Iritis/uveitis  Viral conjunctivitis  Allergic conjunctivitis  Bacterial conjunctivitis    Plan:  Medications (eye drops - names only):    Polymyxin B/trimethoprim  drops    Rationale for Antibiotics:  Antibiotics are prescribed to shorten the duration of symptoms, reduce the risk of transmission, and prevent complications such as:  Keratitis (infection of the cornea)  Corneal ulceration  Progression to more serious ocular infections, especially in immunocompromised patients or contact lens wearers  While some cases of bacterial conjunctivitis are self-limiting, prompt treatment improves comfort and functional recovery (e.g., return to work/school sooner).  Patient Education:  Medication Use: Instill drops into the affected eye(s) as prescribed  Side Effects: Temporary stinging or blurred vision after instillation; mild irritation is common.  Duration: Complete  the full course of treatment even if symptoms improve early.  Hygiene: Wash hands before and after using drops, avoid rubbing eyes, and do not share towels or pillowcases.  Contact lenses: Avoid use until the infection has fully resolved and for at least 24 hours after completing antibiotics.  Medication is essential to prevent complications, limit spread, and promote faster resolution of infection.  Follow-Up:  Follow up in 48 hours if no improvement, or sooner if symptoms worsen.  Return for reassessment if increasing discharge, worsening redness, significant pain, or any vision changes develop.  ER Precautions:  Seek emergency care if any of the following occur:  Sudden vision loss  Severe eye pain  Worsening eyelid swelling (concern for orbital cellulitis)  Photophobia or inability to open the eye  Signs of systemic illness (fever, chills)     Diagnosed with:    ICD-10-CM ICD-9-CM    1. Bacterial conjunctivitis  H10.9 372.39 polymyxin b-trimethoprim  (POLYTRIM) 10000-0.1 UNIT/ML-% ophthalmic solution     041.9           Electronically signed and reviewed by Jackee Phi PA-C        [1]   Allergies  Allergen Reactions    Genecof-Xp Hives    Penicillin G Rash    Penicillins Rash and Itching     rash    Dilaudid  [Hydromorphone Hcl] Itching    Hydrocodone Itching    Hydromorphone Itching    Venlafaxine Unspecified     Felt weird

## 2023-10-23 ENCOUNTER — Telehealth (INDEPENDENT_AMBULATORY_CARE_PROVIDER_SITE_OTHER): Payer: Self-pay | Admitting: Physician Assistant

## 2023-10-23 NOTE — Telephone Encounter (Signed)
 Incoming call from patient stating that she went to Urgent Care on Saturday. She is stating that she was told to call back if her eye didn't get better and that we would put in an ophthalmology referral. Please advise.

## 2023-10-24 NOTE — Telephone Encounter (Signed)
 Called pt in regards to message LDVM (per consent) Informing her that she could come into [facility] for a revaluation on eye for opthalmology

## 2023-10-30 ENCOUNTER — Ambulatory Visit (INDEPENDENT_AMBULATORY_CARE_PROVIDER_SITE_OTHER)

## 2023-10-30 ENCOUNTER — Encounter (INDEPENDENT_AMBULATORY_CARE_PROVIDER_SITE_OTHER): Payer: Self-pay

## 2023-10-30 MED ORDER — METHYLPREDNISOLONE 4 MG OR KIT
ORAL_TABLET | ORAL | 0 refills | Status: AC
Start: 2023-10-30 — End: ?

## 2023-10-30 MED ORDER — TRIAMCINOLONE ACETONIDE 0.1 % EX OINT
1.0000 | TOPICAL_OINTMENT | Freq: Two times a day (BID) | CUTANEOUS | 0 refills | Status: AC
Start: 2023-10-30 — End: ?

## 2023-10-30 NOTE — Progress Notes (Signed)
 [place] - [place]-  [place] - [place] - [place]   [email or web address]  [email or web address]   Telephone: [phone]      CONCERN     VISIT TYPE: SAME DAY SICK VISIT    Encounter Date:  10/30/2023  4:20 PM PDT  PCP: Lionell Charlie Camellia Lynwood  MRN: [id number]    HPI:  Debbie Hudson is a 68 year old female presents for the following   Chief Complaint   Patient presents with    Cough     Patient c/o of cough and congestion x 2 weeks and right eye pain due to stye        Pt presents with c/o rash and congestion following treatment for bacterial conjunctivitis and stye.  Sought care at urgent care, diagnosed with bacterial conjunctivitis starting in one eye, spread to other eye.  Initial treatment with Polytrim drops resulted in allergic reaction causing inflammation.  Saw ophthalmologist who prescribed tobramycin drops, later doxycycline and Tobrex ointment.  Conjunctivitis has since cleared.    Currently developed rash that is itchy, appears like dermatitis, believes likely from allergic reaction to initial eye drops.    Mentions recent illness and continues to experience congestion.  States Z-paks never worked until Medrol  dose pack is prescribed.       PROBLEM  LIST:  Problem List[1]    CURRENT  MEDICATIONS:  Medications Ordered Prior to Encounter[2]  Outpatient Medications Marked as Taking for the 10/30/23 encounter (Office Visit) with Hoy Dunnings, PA   Medication Sig Dispense Refill    albuterol  108 (90 Base) MCG/ACT inhaler Inhale 2 puffs by mouth every 6 hours as needed for Wheezing. 6.7 g 3    ALPRAZolam  (XANAX ) 0.25 MG tablet Take 1 tablet (0.25 mg) by mouth nightly as needed for Anxiety. 5 tablet 0    cetirizine  (ZYRTEC ) 10 MG tablet TAKE 1 TABLET(10 MG) BY MOUTH DAILY 90 tablet 1    doxyCYCLINE (VIBRAMYCIN) 100 MG capsule TAKE 1 CAPSULE BY MOUTH TWICE DAILY FOR 14 DAYS      fluticasone  propionate (FLONASE ) 50 MCG/ACT nasal spray Spray 1 spray into each nostril daily.       fluticasone -salmeterol (ADVAIR) 100-50 MCG/ACT inhaler Inhale 1 puff by mouth every 12 hours. 180 each 1    polymyxin b-trimethoprim  (POLYTRIM) 10000-0.1 UNIT/ML-% ophthalmic solution Place 1 drop into both eyes 4 times daily for 7 days. 10 mL 0    tizanidine  (ZANAFLEX ) 2 MG tablet tizanidine  2 mg tablet   PRN      TOBRADEX ophthalmic ointment APPLY A SMALL AMOUNT INTO THE CONJUCTIVAL SAC OF RIGHT EYE THREE TIMES DAILY      tobramycin-dexAMETHasone (TOBRADEX) ophthalmic suspension SHAKE LIQUID AND INSTILL 1 DROP IN BOTH EYES FOUR TIMES DAILY FOR 1 WEEK          ALLERGIES:    Allergies[3]       PAST SURGICAL HISTORY:  Past Surgical History[4]     FAMILY HISTORY:   Family History[5]      SOCIAL HISTORY:  Social History     Socioeconomic History    Marital status: Married     Spouse name: Not on file    Number of children: 2    Years of education: 2    Highest education level: Not on file   Occupational History    Occupation: Retired / Optometrist   Tobacco Use    Smoking status: Never    Smokeless tobacco: Never   Substance and Sexual Activity  Alcohol use: Yes     Alcohol/week: 1.0 standard drink of alcohol     Types: 1 Glasses of wine per week     Comment: occ    Drug use: No    Sexual activity: Not Currently     Partners: Male   Other Topics Concern    Military Service No    Blood Transfusions No    Caffeine Concern No    Occupational Exposure No    Hobby Hazards No    Sleep Concern Yes     Comment: snoring    Stress Concern No    Weight Concern Yes    Special Diet No    Back Care Yes     Comment: rt mid back stiffness    Exercises Regularly Yes    Bike Helmet Use Yes    Seat Belt Use Yes    Performs Self-Exams Yes   Social History Narrative    Not on file     Social Drivers of Health     Financial Resource Strain: Not on file   Food Insecurity: No Food Insecurity (01/30/2023)    Hunger Vital Sign     Worried About Running Out of Food in the Last Year: Never true     Ran Out of Food in the Last Year: Never  true   Transportation Needs: No Transportation Needs (01/30/2023)    PRAPARE - Therapist, art (Medical): No     Lack of Transportation (Non-Medical): No   Physical Activity: Not on file   Stress: Not on file   Social Connections: Not on file   Intimate Partner Violence: Not on file   Housing Stability: Low Risk  (01/30/2023)    Housing Stability Vital Sign     Unable to Pay for Housing in the Last Year: No     Number of Times Moved in the Last Year: 0     Homeless in the Last Year: No     Tobacco Use History[6]   Social History     Substance and Sexual Activity   Alcohol Use Yes    Alcohol/week: 1.0 standard drink of alcohol    Types: 1 Glasses of wine per week    Comment: occ      Social History     Substance and Sexual Activity   Drug Use No        Immunization History   Administered Date(s) Administered    (Shingles) Herpes Zoster Vaccine (ZOSTAVAX) 11/10/2011    Influenza Vaccine (Unspecified) 10/31/1996, 11/30/2007, 11/06/2009, 09/29/2010, 11/19/2013    Influenza Vaccine >=6 Months 10/23/2008, 12/19/2011, 12/10/2012, 10/10/2016    Influenza Vaccine TIV >=6 Months 11/19/2013    Influenza Vaccine, Trivalent >=6 Months 10/23/2008, 12/19/2011, 12/10/2012    Pneumococcal 23 Vaccine (PNEUMOVAX-23) 09/29/2010    Tdap 08/31/2009    influenza, split (incl. purified surface antigen) 10/31/1996, 11/30/2007, 11/06/2009, 09/29/2010         REVIEW OF SYSTEMS:  Review of Systems  ROS negative except for HPI      PHYSICAL EXAM:   10/30/23  1634   BP: 128/62   Pulse: 65   Temp: 98 F (36.7 C)   Resp: 16   SpO2: 95%     Body mass index is 30.11 kg/m.  Ht Readings from Last 1 Encounters:   10/30/23 5' 3 (1.6 m)     Wt Readings from Last 1 Encounters:   10/30/23 77.1 kg (170 lb)  Physical Exam  Vitals and nursing note reviewed.   Constitutional:       General: She is not in acute distress.     Appearance: Normal appearance. She is not ill-appearing.   Eyes:      General:         Right eye: No  discharge.         Left eye: No discharge.      Extraocular Movements: Extraocular movements intact.      Conjunctiva/sclera: Conjunctivae normal.      Pupils: Pupils are equal, round, and reactive to light.      Comments: Stye of R upper eyelid   Skin:     Findings: Erythema (mild erythema in periorbital region bilaterally) and rash (excoriated rash on L cheek) present.   Neurological:      Mental Status: She is alert and oriented to person, place, and time.   Psychiatric:         Mood and Affect: Mood normal.         Behavior: Behavior normal.         Thought Content: Thought content normal.         Judgment: Judgment normal.           ASSESSMENT & PLAN:    Assessment & Plan  Rhinosinusitis  Active.  Medrol  dose pack.   Recommend increased hydration, steam inhalation, and sleeping with the head of the bed elevated.  Avoid exposure to cigarette smoke.  NSAIDs as needed for pain.   Continue monitoring.    Orders:    methylPREDNISolone  (MEDROL  DOSEPACK) 4 MG tablet; Take as directed on package    Periorbital dermatitis  Active.  Medrol  dose pack.  Triamcinolone as directed.  Monitor.  Orders:    methylPREDNISolone  (MEDROL  DOSEPACK) 4 MG tablet; Take as directed on package    triamcinolone (KENALOG) 0.1 % ointment; Apply 1 Application. topically 2 times daily. Apply a thin layer as directed        ICD-10-CM ICD-9-CM    1. Rhinosinusitis  J32.9 473.9 methylPREDNISolone  (MEDROL  DOSEPACK) 4 MG tablet      2. Periorbital dermatitis  L30.9 692.9 methylPREDNISolone  (MEDROL  DOSEPACK) 4 MG tablet      triamcinolone (KENALOG) 0.1 % ointment            [facility]  [email or web address]        [Electronically signed by Carlin Plume, PA-C of [facility]]           [1]   Patient Active Problem List  Diagnosis    Anxiety    Herpes zoster without complication    Mass of left foot    Recurrent sinusitis    H/O colonoscopy    Family history of malignant neoplasm of colon    Tendinitis of wrist     Arthritis pain    Carpal tunnel syndrome    Allergic rhinitis    Anaclitic depression    Fatigue, unspecified type    Food intolerance    Weight gain    Mild intermittent asthma with exacerbation    Major depressive disorder, recurrent episode, mild    Trigger finger of right hand    Heart palpitations    Inflamed seborrheic keratosis    Back spasm    Neck muscle spasm    Pain of left calf    Viral syndrome    Hypertension, unspecified type    Dizziness    Encounter for Medicare annual wellness  exam    Hyperlipidemia, unspecified hyperlipidemia type    Snoring    Chronic bilateral thoracic back pain    Neck pain    Numerous skin moles    Encounter for screening mammogram for malignant neoplasm of breast    Postmenopausal    Encounter for herb and vitamin supplement management    Skin cancer screening    Overweight with body mass index (BMI) of 28 to 28.9 in adult    Upper respiratory tract infection, unspecified type    Subacute cough    Medicare annual wellness visit, subsequent    Encounter for colorectal cancer screening    History of basal cell carcinoma (BCC)    Conjunctivitis of left eye, unspecified conjunctivitis type    Actinic keratoses    Ear itching    Constipation, unspecified constipation type    Osteopenia, unspecified location    Androgenetic alopecia    Impaired fasting glucose    Discomfort of ear, unspecified laterality    Sleep disorder breathing    Restless legs syndrome (RLS)    Non-celiac gluten sensitivity    Vitamin B12 deficiency   [2]   Current Outpatient Medications on File Prior to Visit   Medication Sig Dispense Refill    albuterol  108 (90 Base) MCG/ACT inhaler Inhale 2 puffs by mouth every 6 hours as needed for Wheezing. 6.7 g 3    ALPRAZolam  (XANAX ) 0.25 MG tablet Take 1 tablet (0.25 mg) by mouth nightly as needed for Anxiety. 5 tablet 0    cetirizine  (ZYRTEC ) 10 MG tablet TAKE 1 TABLET(10 MG) BY MOUTH DAILY 90 tablet 1    doxyCYCLINE (VIBRAMYCIN) 100 MG capsule TAKE 1 CAPSULE BY  MOUTH TWICE DAILY FOR 14 DAYS      fluticasone  propionate (FLONASE ) 50 MCG/ACT nasal spray Spray 1 spray into each nostril daily.      fluticasone -salmeterol (ADVAIR) 100-50 MCG/ACT inhaler Inhale 1 puff by mouth every 12 hours. 180 each 1    polymyxin b-trimethoprim  (POLYTRIM) 10000-0.1 UNIT/ML-% ophthalmic solution Place 1 drop into both eyes 4 times daily for 7 days. 10 mL 0    tizanidine  (ZANAFLEX ) 2 MG tablet tizanidine  2 mg tablet   PRN      TOBRADEX ophthalmic ointment APPLY A SMALL AMOUNT INTO THE CONJUCTIVAL SAC OF RIGHT EYE THREE TIMES DAILY      tobramycin-dexAMETHasone (TOBRADEX) ophthalmic suspension SHAKE LIQUID AND INSTILL 1 DROP IN BOTH EYES FOUR TIMES DAILY FOR 1 WEEK       No current facility-administered medications on file prior to visit.   [3]   Allergies  Allergen Reactions    Genecof-Xp Hives    Penicillin G Rash    Penicillins Rash and Itching     rash    Dilaudid  [Hydromorphone Hcl] Itching    Hydrocodone Itching    Hydromorphone Itching    Venlafaxine Unspecified     Felt weird   [4]   Past Surgical History:  Procedure Laterality Date    SINUS SURGERY  04/17/2017    COLONOSCOPY  11/24/2016    repeat in 5 years    ADENOIDECTOMY  1962    ANTERIOR CRUCIATE LIGAMENT REPAIR Left     CARPAL TUNNEL RELEASE Bilateral     CESAREAN SECTION, CLASSIC  1984, 1986    TONSILLECTOMY AND ADENOIDECTOMY     [5]   Family History  Problem Relation Name Age of Onset    Cancer Mother Jereld Glen  Lymphoma    Hypertension Mother Jereld Glen     Cancer Father Lynwood Glen         Colon, Chondrosarcoma    Hypertension Father Lynwood Glen     Cholesterol/Lipid Disorder Father Lynwood Glen     Hypertension Brother Debby Glen     Hypertension Brother Ozell Glen    [6]   Social History  Tobacco Use   Smoking Status Never   Smokeless Tobacco Never

## 2023-11-15 ENCOUNTER — Ambulatory Visit (INDEPENDENT_AMBULATORY_CARE_PROVIDER_SITE_OTHER): Admitting: Family Medicine

## 2023-11-17 ENCOUNTER — Encounter (INDEPENDENT_AMBULATORY_CARE_PROVIDER_SITE_OTHER): Payer: Self-pay

## 2023-11-17 ENCOUNTER — Ambulatory Visit (INDEPENDENT_AMBULATORY_CARE_PROVIDER_SITE_OTHER)

## 2023-11-17 VITALS — BP 106/66 | HR 69 | Temp 97.6°F | Resp 16 | Ht 63.0 in | Wt 175.8 lb

## 2023-11-17 MED ORDER — TIZANIDINE HCL 2 MG OR TABS
2.0000 mg | ORAL_TABLET | Freq: Three times a day (TID) | ORAL | 0 refills | Status: AC | PRN
Start: 2023-11-17 — End: ?

## 2023-11-17 NOTE — Assessment & Plan Note (Addendum)
 Stable   Continue Zanaflex  2 mg three times per day (REFILLED)   Stretch as tolerated   Take Tylenol as needed    Ice and heat compressions   Continue to monitor

## 2023-11-17 NOTE — Assessment & Plan Note (Signed)
 Stable   Apply a warm compress to the affected eyelid for 10-15 minutes, 3-4 times a day   After applying a warm compress, gently massage the area in a circular motion for a few minutes to help drain the oil gland.   Keep the eyelids clean by gently washing the area with mild baby shampoo diluted with water, or use commercial eyelid scrubs.   While the chalazion is present, avoid using eye makeup and contact lenses until the bump has resolved  Avoid rubbing or touching the eyes.   Follow up with Ophthalmologist as scheduled.

## 2023-11-17 NOTE — Progress Notes (Addendum)
 [place] - [place]-  [place] - [place] - [place]   [email or web address] and [email or web address]  Telephone: [phone]      History of Present Illness   Encounter Date:  11/17/2023  9:24 AM   PCP: Debbie Hudson  MRN: [id number]   DOB: [birth year]     History of Present Illness:   Debbie Hudson is a 68 year old female presents   Chief Complaint   Patient presents with    Eye Problem     Pt c/o L eye issue (  x 5 days.    Medication Refill     Pt desires RX RF of tizanidine  2mg  to have on hand PRN  for back issue       Left Eye Chalazion   Patient presents for stye to left eye.   Patient reports discomfort to left lower eyelid.   She notes that stye started to drain yesterday and reports improvement in size.   Patient has discontinue using eye makeup for the last month.  She does not wear contact lenses.   Patient has appointment with Ophthalmology in 3 days (11/18/23).    Patient reports that she was given eye drops in early October for bacterial conjunctivitis and right eye stye. However, patient was allergic to Polytrim and medication discontinued.   Patient was evaluated by Ophthalmology and given oral doxycycline for conjunctivitis. Right eye stye has improved.       Low Back Pain   Patient with history of low back pain with occasional spasms.   She has attended Physical Therapy with great benefit.   Patient takes Zanaflex  as needed, requesting refill today.     Other chronic conditions, if discussed, are documented below in the A/P  Problem List and Family history in the EMR have been reviewed and updated as appropriate    ASSESSMENT & PLAN:     Debbie Hudson was seen today for eye problem and medication refill.    Diagnoses and all orders for this visit:    Chalazion left lower eyelid  Assessment & Plan:  Stable   Apply a warm compress to the affected eyelid for 10-15 minutes, 3-4 times a day   After applying a warm compress, gently massage the area in a circular motion for a few minutes to  help drain the oil gland.   Keep the eyelids clean by gently washing the area with mild baby shampoo diluted with water, or use commercial eyelid scrubs.   While the chalazion is present, avoid using eye makeup and contact lenses until the bump has resolved  Avoid rubbing or touching the eyes.   Follow up with Ophthalmologist as scheduled.       Back spasm  Assessment & Plan:  Stable   Continue Zanaflex  2 mg three times per day (REFILLED)   Stretch as tolerated   Take Tylenol as needed    Ice and heat compressions   Continue to monitor     Orders:  -     tiZANidine  (ZANAFLEX ) 2 MG tablet; Take 1 tablet (2 mg) by mouth 3 times daily as needed (See frequency).          ICD-10-CM ICD-9-CM    1. Chalazion left lower eyelid  H00.15 373.2       2. Back spasm  M62.830 724.8 tiZANidine  (ZANAFLEX ) 2 MG tablet              SUPPORTING OBJECTIVE / SUBJECTIVE:     PHYSICAL EXAM:  11/17/23  0915   BP: 106/66   Pulse: 69   Temp: 97.6 F (36.4 C)   Resp: 16   SpO2: 96%       Ht Readings from Last 1 Encounters:   11/17/23 5' 3 (1.6 m)       Wt Readings from Last 1 Encounters:   11/17/23 79.7 kg (175 lb 12.8 oz)     Body mass index is 31.14 kg/m.         11/17/2023     9:15 AM 10/30/2023     4:34 PM 10/21/2023     9:11 AM 05/16/2023     8:32 AM 04/11/2023     9:22 AM 01/30/2023    10:16 AM   Date Weight Recorded   Metric 79.742 kg 77.111 kg 77.111 kg 76.658 kg 75.751 kg 77.111 kg   Pounds/Ounces 175 lb 12.8 oz 170 lb 170 lb 169 lb 167 lb 170 lb        Physical Exam  Vitals reviewed.   Constitutional:       General: She is not in acute distress.     Appearance: Normal appearance. She is not ill-appearing.   HENT:      Head: Normocephalic.   Eyes:      General:         Right eye: Hordeolum present.         Left eye: Hordeolum present.     Extraocular Movements: Extraocular movements intact.   Cardiovascular:      Rate and Rhythm: Normal rate and regular rhythm.      Pulses: Normal pulses.      Heart sounds: Normal heart sounds.    Pulmonary:      Effort: Pulmonary effort is normal.      Breath sounds: Normal breath sounds.   Skin:     General: Skin is warm and dry.   Neurological:      General: No focal deficit present.      Mental Status: She is alert and oriented to person, place, and time.   Psychiatric:         Mood and Affect: Mood normal.           ALLERGY / MEDICATIONS / SURGICAL HISTORY / SOCIAL HISTORY / IMMUNIZATIONS / ROS      PAST SURGICAL HISTORY:  Past Surgical History[1]     SOCIAL HISTORY:  Social History (brief version)[2]    Immunization History   Administered Date(s) Administered    (Shingles) Herpes Zoster Vaccine (ZOSTAVAX) 11/10/2011    Influenza Vaccine (Adjuvanted) >=65 Years 09/09/2021    Influenza Vaccine (Adjuvanted), Trivalent >=65 Years 10/06/2022    Influenza Vaccine (Unspecified) 10/31/1996, 11/30/2007, 11/06/2009, 09/29/2010, 11/19/2013    Influenza Vaccine >=6 Months 10/23/2008, 12/19/2011, 12/10/2012, 10/10/2016    Influenza Vaccine TIV >=6 Months 11/19/2013    Influenza Vaccine, Trivalent >=6 Months 10/23/2008, 12/19/2011, 12/10/2012    Pneumococcal 23 Vaccine (PNEUMOVAX-23) 09/29/2010    Tdap 08/31/2009    influenza, split (incl. purified surface antigen) 10/31/1996, 11/30/2007, 11/06/2009, 09/29/2010       ALLERGIES:  Allergies[3]     CURRENT  MEDICATIONS:  Outpatient Medications Marked as Taking for the 11/17/23 encounter (Office Visit) with Debbie Dines, NP   Medication Sig Dispense Refill    tiZANidine  (ZANAFLEX ) 2 MG tablet Take 1 tablet (2 mg) by mouth 3 times daily as needed (See frequency). 30 tablet 0            REVIEW OF SYSTEMS:   (  unless listed below, the ROS  is negative except as listed in HPI)  Review of Systems         [Electronically Signed by Debbie Granville, NP]  [facility]    Electronically signed and reviewed by Debbie Granville, NP      [facility]  [email or web address]                         [1]   Past Surgical History:  Procedure Laterality Date     SINUS SURGERY  04/17/2017    COLONOSCOPY  11/24/2016    repeat in 5 years    ADENOIDECTOMY  1962    ANTERIOR CRUCIATE LIGAMENT REPAIR Left     CARPAL TUNNEL RELEASE Bilateral     CESAREAN SECTION, CLASSIC  1984, 1986    TONSILLECTOMY AND ADENOIDECTOMY     [2]   Socioeconomic History    Marital status: Married    Number of children: 2    Years of education: 2   Occupational History    Occupation: Retired / Optometrist   Tobacco Use    Smoking status: Never    Smokeless tobacco: Never   Substance and Sexual Activity    Alcohol use: Yes     Alcohol/week: 1.0 standard drink of alcohol     Types: 1 Glasses of wine per week     Comment: occ    Drug use: No    Sexual activity: Not Currently     Partners: Male   Social Activities of Daily Living Present    Military Service No    Blood Transfusions No    Caffeine Concern No    Occupational Exposure No    Hobby Hazards No    Sleep Concern Yes     Comment: snoring    Stress Concern No    Weight Concern Yes    Special Diet No    Back Care Yes     Comment: rt mid back stiffness    Exercises Regularly Yes    Bike Helmet Use Yes    Seat Belt Use Yes    Performs Self-Exams Yes   [3]   Allergies  Allergen Reactions    Genecof-Xp Hives    Penicillin G Rash    Penicillins Rash and Itching     rash    Dilaudid  [Hydromorphone Hcl] Itching    Hydrocodone Itching    Hydromorphone Itching    Venlafaxine Unspecified     Felt weird

## 2023-11-21 ENCOUNTER — Ambulatory Visit (INDEPENDENT_AMBULATORY_CARE_PROVIDER_SITE_OTHER): Admitting: Family Medicine

## 2023-11-28 ENCOUNTER — Ambulatory Visit (INDEPENDENT_AMBULATORY_CARE_PROVIDER_SITE_OTHER): Admitting: Family Medicine

## 2023-12-16 ENCOUNTER — Encounter (INDEPENDENT_AMBULATORY_CARE_PROVIDER_SITE_OTHER): Payer: Self-pay | Admitting: Family Medicine

## 2023-12-19 ENCOUNTER — Ambulatory Visit (INDEPENDENT_AMBULATORY_CARE_PROVIDER_SITE_OTHER): Admitting: Family Medicine

## 2023-12-19 ENCOUNTER — Encounter (INDEPENDENT_AMBULATORY_CARE_PROVIDER_SITE_OTHER): Payer: Self-pay | Admitting: Family Medicine

## 2023-12-19 VITALS — BP 124/66 | HR 60 | Temp 97.7°F | Resp 16 | Ht 63.0 in | Wt 173.0 lb

## 2023-12-19 MED ORDER — TIRZEPATIDE-WEIGHT MANAGEMENT 2.5 MG/0.5ML SC SOLN: 2.5000 mg | SUBCUTANEOUS | 11 refills | Status: DC

## 2023-12-19 NOTE — Progress Notes (Unsigned)
 [place] - [place]-  [place] - [place] - [place]   [email or web address] and [email or web address]  Telephone: [phone]      History of Present Illness   Encounter Date:  12/19/2023   9:30 AM PST   PCP: Lionell Charlie Camellia Lynwood  MRN: [id number]   DOB: 1955-12-04     History of Present Illness:   Debbie Hudson is a 67 year old female presents   Chief Complaint   Patient presents with    Follow Up     68 y.o. female presents in clinic to follow up seasonal asthma      Patient is followed by the following specialist(s):  Sleep Medicine -  Dr Antoinette, last appointment 2022     Equipment for mobility:  None     Seasonal Asthma  Patient is compliant with taking Flonase  nasal spray daily  Has Albuterol  108 and Advair inhalers PRN ~ admits to using them about 6 months ago  Denies any recent asthma attacks   Notes symptoms will be triggered during colds   Patient denies chest pain, shortness of breath and palpitations    Left foot bump   Report to have a left foot bunion on big toe ~ but has noticed a new bump on fifth metatarsal and is endorsing pain ~ the pain is triggered daily  Denies any recent trauma or injuries  Is compliant to using proper shoes, but despite is endorsing pain   Pain level today is a 5/10 ~ pain presents intermittently and is taking tylenol for pain   Denies any motion limitations at this time    Weight Management   Mentioned she is concerned of unsuccessful weight loss  Is following a nutrition program with ~ Dr. Hollie online   Is following a fitness guideline program as well and has modified her daily diet  Is working out 3x a week is tracking daily steps  Denies any fhx of pancreas or thyroid cancer    Weight:      12/19/2023     9:20 AM 11/17/2023     9:15 AM 10/30/2023     4:34 PM 10/21/2023     9:11 AM 05/16/2023     8:32 AM 04/11/2023     9:22 AM   Date Weight Recorded   Metric 78.472 kg 79.742 kg 77.111 kg 77.111 kg 76.658 kg 75.751 kg   Pounds/Ounces 173 lb 175 lb 12.8 oz 170 lb 170 lb  169 lb 167 lb      Other chronic conditions, if discussed, are documented below in the A/P  Problem List and Family history in the EMR have been reviewed and updated as appropriate    ASSESSMENT & PLAN:     Assessment & Plan  Allergic rhinitis, unspecified seasonality, unspecified trigger  Stable, controlled   Continue Flonase  as directed   Recommend over the counter medication like Claritin or Allegra  with nasal Flonase   Discussed daily medication use to control symptoms  Advised nasal saline rinses, using a home air purifier, and removing/avoiding allergens   Follow up if symptoms persist  Continue to Monitor     Chronic foot pain, left  Stable, new  Hx of left foot bunion   Podiatry referral has been placed, advised to establish care  Xray of left foot placed in clinic   Continue the use of proper shoe wear as discussed in clinic  Patient agrees to plan   Continue to monitor  Obesity (BMI 30-39.9)  Stable  Start Zepbound   2.5 mg as prescribed and discussed in clinic ~ sent to [employer] regarding proper exercise and diet for weight loss and management  Drink plenty of water each day, and avoid beverages that are high in sugar or artificial sweeteners  Exercise via strength training exercises at least twice a week.  Adequate hours of sleep - 6-8 hours/night  Goal of 150 minutes of exercise each week  Avoid eating late at night   Discussed portion control  Patient agrees to plan          ICD-10-CM ICD-9-CM    1. Allergic rhinitis, unspecified seasonality, unspecified trigger  J30.9 477.9       2. Chronic foot pain, left  M79.672 729.5 X-Ray Foot Complete, min 2 views    G89.29 338.29 Consult/Referral To Podiatry Ext Com [facility] [facility]      3. Obesity (BMI 30-39.9)  E66.9 278.00 tirzepatide -Weight Management (ZEPBOUND ) 2.5 MG/0.5ML injection              SUPPORTING OBJECTIVE / SUBJECTIVE:     PHYSICAL EXAM:     12/19/23  0920   BP: 124/66   Pulse: 60   Temp: 97.7 F (36.5 C)   Resp: 16   SpO2: 98%        Ht Readings from Last 1 Encounters:   12/19/23 5' 3 (1.6 m)       Wt Readings from Last 1 Encounters:   12/19/23 78.5 kg (173 lb)     Body mass index is 30.65 kg/m.         12/19/2023     9:20 AM 11/17/2023     9:15 AM 10/30/2023     4:34 PM 10/21/2023     9:11 AM 05/16/2023     8:32 AM 04/11/2023     9:22 AM   Date Weight Recorded   Metric 78.472 kg 79.742 kg 77.111 kg 77.111 kg 76.658 kg 75.751 kg   Pounds/Ounces 173 lb 175 lb 12.8 oz 170 lb 170 lb 169 lb 167 lb        Physical Exam  Vitals and nursing note reviewed. Exam conducted with a chaperone present.   Constitutional:       Appearance: She is well-developed.   Cardiovascular:      Rate and Rhythm: Normal rate and regular rhythm.      Pulses: Normal pulses.      Heart sounds: Normal heart sounds. No murmur heard.     No gallop.   Pulmonary:      Effort: Pulmonary effort is normal. No respiratory distress.      Breath sounds: Normal breath sounds. No wheezing or rales.   Skin:     General: Skin is warm and dry.   Psychiatric:         Behavior: Behavior normal.           ALLERGY / MEDICATIONS / SURGICAL HISTORY / SOCIAL HISTORY / IMMUNIZATIONS / ROS      PAST SURGICAL HISTORY:  Past Surgical History[1]     SOCIAL HISTORY:  Social History (brief version)[2]    Immunization History   Administered Date(s) Administered    (Shingles) Herpes Zoster Vaccine (ZOSTAVAX) 11/10/2011    Influenza Vaccine (Adjuvanted) >=65 Years 09/09/2021    Influenza Vaccine (Adjuvanted), Trivalent >=65 Years 10/06/2022    Influenza Vaccine (Unspecified) 10/31/1996, 11/30/2007, 11/06/2009, 09/29/2010, 11/19/2013    Influenza Vaccine >=6 Months 10/23/2008, 12/19/2011, 12/10/2012, 10/10/2016    Influenza Vaccine TIV >=6 Months  11/19/2013    Influenza Vaccine, Trivalent >=6 Months 10/23/2008, 12/19/2011, 12/10/2012    Pneumococcal 23 Vaccine (PNEUMOVAX-23) 09/29/2010    Tdap 08/31/2009    influenza, split (incl. purified surface antigen) 10/31/1996, 11/30/2007, 11/06/2009, 09/29/2010        ALLERGIES:  Allergies[3]     CURRENT  MEDICATIONS:  Outpatient Medications Marked as Taking for the 12/19/23 encounter (Office Visit) with Elissa Devere NOVAK, MD   Medication Sig Dispense Refill    albuterol  108 (90 Base) MCG/ACT inhaler Inhale 2 puffs by mouth every 6 hours as needed for Wheezing. 6.7 g 3    ALPRAZolam  (XANAX ) 0.25 MG tablet Take 1 tablet (0.25 mg) by mouth nightly as needed for Anxiety. 5 tablet 0    cetirizine  (ZYRTEC ) 10 MG tablet TAKE 1 TABLET(10 MG) BY MOUTH DAILY 90 tablet 1    fluticasone  propionate (FLONASE ) 50 MCG/ACT nasal spray Spray 1 spray into each nostril daily.      fluticasone -salmeterol (ADVAIR) 100-50 MCG/ACT inhaler Inhale 1 puff by mouth every 12 hours. 180 each 1    tiZANidine  (ZANAFLEX ) 2 MG tablet Take 1 tablet (2 mg) by mouth 3 times daily as needed (See frequency). 30 tablet 0    triamcinolone  (KENALOG ) 0.1 % ointment Apply 1 Application. topically 2 times daily. Apply a thin layer as directed 1 each 0            REVIEW OF SYSTEMS:   (unless listed below, the ROS  is negative except as listed in HPI)  Review of Systems   Musculoskeletal:         Left foot pain            By signing below, I acknowledge that I have reviewed the above note,  dictated by me and scribed by Herma Gins, for accuracy and edited  where necessary. The note accurately reflects the services provided at this  encounter. We retain the right to modify this information in the event of  errors. Occasional errors in punctuation, grammar and content may occur.         Electronically signed and reviewed by Devere NOVAK Elissa, MD      [facility]  [email or web address]                         [1]   Past Surgical History:  Procedure Laterality Date    SINUS SURGERY  04/17/2017    COLONOSCOPY  11/24/2016    repeat in 5 years    ADENOIDECTOMY  1962    ANTERIOR CRUCIATE LIGAMENT REPAIR Left     CARPAL TUNNEL RELEASE Bilateral     CESAREAN SECTION, CLASSIC  1984, 1986    TONSILLECTOMY AND  ADENOIDECTOMY     [2]   Socioeconomic History    Marital status: Married    Number of children: 2    Years of education: 2   Occupational History    Occupation: Retired / Optometrist   Tobacco Use    Smoking status: Never    Smokeless tobacco: Never   Substance and Sexual Activity    Alcohol use: Yes     Alcohol/week: 1.0 standard drink of alcohol     Types: 1 Glasses of wine per week     Comment: occ    Drug use: No    Sexual activity: Not Currently     Partners: Male   Social Activities of Daily Living Architectural Technologist  No    Blood Transfusions No    Caffeine Concern No    Occupational Exposure No    Hobby Hazards No    Sleep Concern Yes     Comment: snoring    Stress Concern No    Weight Concern Yes    Special Diet No    Back Care Yes     Comment: rt mid back stiffness    Exercises Regularly Yes    Bike Helmet Use Yes    Seat Belt Use Yes    Performs Self-Exams Yes   [3]   Allergies  Allergen Reactions    Genecof-Xp Hives    Penicillin G Rash    Penicillins Rash and Itching     rash    Dilaudid  [Hydromorphone Hcl] Itching    Hydrocodone Itching    Hydromorphone Itching    Venlafaxine Unspecified     Felt weird

## 2023-12-19 NOTE — Assessment & Plan Note (Addendum)
 Stable, new  Hx of left foot bunion   Podiatry referral has been placed, advised to establish care  Xray of left foot placed in clinic   Continue the use of proper shoe wear as discussed in clinic  Patient agrees to plan   Continue to monitor

## 2023-12-19 NOTE — Assessment & Plan Note (Signed)
 Stable  Start Zepbound 2.5 mg as prescribed and discussed in clinic ~ sent to [employer] regarding proper exercise and diet for weight loss and management  Drink plenty of water each day, and avoid beverages that are high in sugar or artificial sweeteners  Exercise via strength training exercises at least twice a week.  Adequate hours of sleep - 6-8 hours/night  Goal of 150 minutes of exercise each week  Avoid eating late at night   Discussed portion control  Patient agrees to plan

## 2023-12-19 NOTE — Patient Instructions (Addendum)
 12/19/2023     9:20 AM 11/17/2023     9:15 AM 10/30/2023     4:34 PM 10/21/2023     9:11 AM 05/16/2023     8:32 AM 04/11/2023     9:22 AM   Date Weight Recorded   Metric 78.472 kg 79.742 kg 77.111 kg 77.111 kg 76.658 kg 75.751 kg   Pounds/Ounces 173 lb 175 lb 12.8 oz 170 lb 170 lb 169 lb 167 lb       Customer support: Call [phone]

## 2023-12-19 NOTE — Assessment & Plan Note (Addendum)
 Stable, controlled   Continue Flonase  as directed   Recommend over the counter medication like Claritin or Allegra  with nasal Flonase   Discussed daily medication use to control symptoms  Advised nasal saline rinses, using a home air purifier, and removing/avoiding allergens   Follow up if symptoms persist  Continue to Monitor

## 2024-01-09 ENCOUNTER — Encounter (INDEPENDENT_AMBULATORY_CARE_PROVIDER_SITE_OTHER): Payer: Self-pay

## 2024-01-30 ENCOUNTER — Telehealth (INDEPENDENT_AMBULATORY_CARE_PROVIDER_SITE_OTHER): Admitting: Family Medicine

## 2024-01-30 VITALS — BP 118/67 | Ht 63.0 in | Wt 166.0 lb

## 2024-01-30 MED ORDER — ZEPBOUND 5 MG/0.5ML SC SOLN: 5.0000 mg | SUBCUTANEOUS | 11 refills | Status: DC

## 2024-01-30 NOTE — Progress Notes (Signed)
 [place]- [place] - [place]-  [place] - [place] - [email or web address]  [email or web address]   Telephone: [phone]      CONCERN     Encounter Date:  01/30/2024   8:10 AM PST   PCP: Lionell Charlie Camellia Lynwood  MRN: [id number]   DOB: 08/11/1955        CONCERN:   Debbie Hudson is a 69 year old female presents   Chief Complaint   Patient presents with    Weight Management     6 week follow up weight      Care Management Referral:  CCM not discussed     Patient is followed by the following specialist(s):  Sleep Medicine -  Dr Antoinette, last appointment 2022     Equipment for mobility:  None     HPI:     Seasonal Asthma  Patient is compliant with taking Flonase  nasal spray daily  Has Albuterol  108 and Advair inhalers PRN ~ admits to using them about 6 months ago  Denies any recent asthma attacks   Notes symptoms will be triggered during colds   Patient denies chest pain, shortness of breath and palpitations     Weight Management   Mentioned she is concerned of unsuccessful weight loss  She is following a nutrition program with ~ Dr. Hollie online, also following a fitness guideline program as well and has modified her daily diet  Reports working out 3x a week and tracking daily steps  Denies any fhx of pancreas or thyroid cancer  Interested in self pay options for weight loss      01/30/2024     8:03 AM 12/19/2023     9:20 AM 11/17/2023     9:15 AM 10/30/2023     4:34 PM 10/21/2023     9:11 AM 05/16/2023     8:32 AM   Date Weight Recorded   Metric 75.297 kg 78.472 kg 79.742 kg 77.111 kg 77.111 kg 76.658 kg   Pounds/Ounces 166 lb 173 lb 175 lb 12.8 oz 170 lb 170 lb 169 lb         01/30/2024     8:03 AM 12/19/2023     9:20 AM 11/17/2023     9:15 AM 10/30/2023     4:34 PM 10/21/2023     9:11 AM 05/16/2023     8:32 AM   Date BMI Recorded   BMI 29.41 kg/m2 30.65 kg/m2 31.14 kg/m2 30.11 kg/m2 30.11 kg/m2 29.47 kg/m2        Other chronic conditions, if discussed, are documented below in the A/P  Problem List and Family  history in the EMR have been reviewed and updated as appropriate    ASSESSMENT/   PLAN       ICD-10-CM ICD-9-CM    1. Obesity (BMI 30-39.9)  E66.9 278.00 tirzepatide -Weight Management (ZEPBOUND ) 5 MG/0.5ML injection      2. Hyperlipidemia, unspecified hyperlipidemia type  E78.5 272.4 CBC w/ Diff Lavender      Comprehensive Metabolic Panel      Lipid Panel Green Plasma Separator Tube      TSH w/ Reflex to FT4        Assessment & Plan  Obesity (BMI 30-39.9)  Uncontrolled  Patient aware that even though she meets FDA criteria for semaglutide for weight management and treatment of obesity, unfortunately their insurance does not cover this indication   Counseled patient on risks and benefits of weight loss clinics such as [facility], [facility], and  [facility]  The patient will pay out of pocket for the Zeppbund via [employer]   Patient counseled on side effects, risks, benefits, and mechanism of action   Eat small, frequent meals, no carbs after 6 pm unless just working out, protein with every meal, good sources - 100% whey protein powder, egg whites, beef jerky, cottage cheese, tuna, avoid white breads and sugars, eat complex carbs, fresh fruits and veggies  Discussed the need for increase in Exercise and improvement of diet  Food Journal advised  Recommend Fast, Feast, Repeat by Rosanne Drones  Recommend Obesity Code by Selinda Fall, MD  Recommend How to Lose Weight for the Last Time: Brain-Based Solutions for Permanent Weight Loss by Woodie Amber, MD  Continue to monitor    Hyperlipidemia, unspecified hyperlipidemia type  Stable  Lipid regimen: none   Complete labs as ordered and follow up pending results   Advised patient to consume a low sugar / low simple carbohydrate diet and the need to increase plant based fiber (25 grams/day min), vegetables and whole grains to help lower cholesterol  Recommend increasing exercise and monitoring diet with goal of losing weight  Continue to monitor     Lab Results    Component Value Date    CHOL 222 (H) 09/30/2022    HDL 71 09/30/2022    LDL 133 (H) 09/30/2022    TRIG 85 09/30/2022             SUPPORTING OBJECTIVE  / SUBJECTIVE     PHYSICAL EXAM:   01/30/24  0803   BP: 118/67     Ht Readings from Last 1 Encounters:   01/30/24 5' 3 (1.6 m)     Wt Readings from Last 1 Encounters:   01/30/24 75.3 kg (166 lb)     Body mass index is 29.41 kg/m.          01/30/2024     8:03 AM 12/19/2023     9:20 AM 11/17/2023     9:15 AM 10/30/2023     4:34 PM 10/21/2023     9:11 AM 05/16/2023     8:32 AM   Date Weight Recorded   Metric 75.297 kg 78.472 kg 79.742 kg 77.111 kg 77.111 kg 76.658 kg   Pounds/Ounces 166 lb 173 lb 175 lb 12.8 oz 170 lb 170 lb 169 lb             Physical Exam  Vitals and nursing note reviewed.   Constitutional:       Appearance: She is well-developed.   Cardiovascular:      Rate and Rhythm: Normal rate and regular rhythm.      Heart sounds: Normal heart sounds.   Pulmonary:      Effort: Pulmonary effort is normal.      Breath sounds: Normal breath sounds.   Skin:     General: Skin is warm and dry.   Psychiatric:         Behavior: Behavior normal.           ALLERGY / MEDICATIONS / SURGICAL  HISTORY /  SOCIAL HISTORY / IMMUNICATIONS / ROS     ALLERGIES:  Allergies[1]     CURRENT  MEDICATIONS:  Outpatient Medications Marked as Taking for the 01/30/24 encounter [facility]) with Elissa Devere NOVAK, MD   Medication Sig Dispense Refill    albuterol  108 (90 Base) MCG/ACT inhaler Inhale 2 puffs by mouth every 6 hours as needed for Wheezing. 6.7  g 3    ALPRAZolam  (XANAX ) 0.25 MG tablet Take 1 tablet (0.25 mg) by mouth nightly as needed for Anxiety. 5 tablet 0    cetirizine  (ZYRTEC ) 10 MG tablet TAKE 1 TABLET(10 MG) BY MOUTH DAILY 90 tablet 1    fluticasone  propionate (FLONASE ) 50 MCG/ACT nasal spray Spray 1 spray into each nostril daily.      fluticasone -salmeterol (ADVAIR) 100-50 MCG/ACT inhaler Inhale 1 puff by mouth every 12 hours. 180 each 1    tiZANidine  (ZANAFLEX ) 2  MG tablet Take 1 tablet (2 mg) by mouth 3 times daily as needed (See frequency). 30 tablet 0        PAST SURGICAL HISTORY:  Past Surgical History[2]     SOCIAL HISTORY:  none     Socioeconomic History    Marital status: Married    Number of children: 2    Years of education: 2   Occupational History    Occupation: Retired / Optometrist   Tobacco Use    Smoking status: Never    Smokeless tobacco: Never   Substance and Sexual Activity    Alcohol use: Yes     Alcohol/week: 1.0 standard drink of alcohol     Types: 1 Glasses of wine per week     Comment: occ    Drug use: No    Sexual activity: Not Currently     Partners: Male   Other Topics Concern    Military Service No    Blood Transfusions No    Caffeine Concern No    Occupational Exposure No    Hobby Hazards No    Sleep Concern Yes     Comment: snoring    Stress Concern No    Weight Concern Yes    Special Diet No    Back Care Yes     Comment: rt mid back stiffness    Exercises Regularly Yes    Bike Helmet Use Yes    Seat Belt Use Yes    Performs Self-Exams Yes     Tobacco Use History[3]   Social History     Substance and Sexual Activity   Alcohol Use Yes    Alcohol/week: 1.0 standard drink of alcohol    Types: 1 Glasses of wine per week    Comment: occ      Social History     Substance and Sexual Activity   Drug Use No        PAST MEDICAL HISTORY:  Past Medical History[4]    FAMILY HISTORY:   Family History[5]      IMMUNIZATION HISTORY:  Immunization History   Administered Date(s) Administered    (Shingles) Herpes Zoster Vaccine (ZOSTAVAX) 11/10/2011    Influenza Vaccine (Adjuvanted) >=65 Years 09/09/2021    Influenza Vaccine (Adjuvanted), Trivalent >=65 Years 10/06/2022    Influenza Vaccine (Unspecified) 10/31/1996, 11/30/2007, 11/06/2009, 09/29/2010, 11/19/2013    Influenza Vaccine >=6 Months 10/23/2008, 12/19/2011, 12/10/2012, 10/10/2016    Influenza Vaccine TIV >=6 Months 11/19/2013    Influenza Vaccine, Trivalent >=6 Months 10/23/2008, 12/19/2011,  12/10/2012    Pneumococcal 23 Vaccine (PNEUMOVAX-23) 09/29/2010    Tdap 08/31/2009    influenza, split (incl. purified surface antigen) 10/31/1996, 11/30/2007, 11/06/2009, 09/29/2010         REVIEW OF SYSTEMS:   (unless listed below, the ROS  is negative except as listed in HPI)    By signing below, I acknowledge that I have reviewed the above note, dictated by me and scribed by Enos Amor,  for accuracy and edited  where necessary. The note accurately reflects the services provided at this  encounter. We retain the right to modify this information in the event of  errors. Occasional errors in punctuation, grammar and content may occur.            Electronically reviewed and signed by Devere Simons, MD.      [facility]  [email or web address]         [1]   Allergies  Allergen Reactions    Genecof-Xp Hives    Penicillin G Rash    Penicillins Rash and Itching     rash    Dilaudid  [Hydromorphone Hcl] Itching    Hydrocodone Itching    Hydromorphone Itching    Venlafaxine Unspecified     Felt weird   [2]   Past Surgical History:  Procedure Laterality Date    SINUS SURGERY  04/17/2017    COLONOSCOPY  11/24/2016    repeat in 5 years    ADENOIDECTOMY  1962    ANTERIOR CRUCIATE LIGAMENT REPAIR Left     CARPAL TUNNEL RELEASE Bilateral     CESAREAN SECTION, CLASSIC  1984, 1986    TONSILLECTOMY AND ADENOIDECTOMY     [3]   Social History  Tobacco Use   Smoking Status Never   Smokeless Tobacco Never   [4]   Past Medical History:  Diagnosis Date    Anxiety     Asthma 05/07/2021    Chest congestion 01/13/2017    Hypertension 04/07/2020    Major depressive disorder, single episode     Recurrent sinusitis 01/13/2017    Sinus pressure 12/20/2016   [5]   Family History  Problem Relation Name Age of Onset    Cancer Mother Jereld Glen         Lymphoma    Hypertension Mother Jereld Glen     Cancer Father Lynwood Glen         Colon, Chondrosarcoma    Hypertension Father Lynwood Glen     Cholesterol/Lipid Disorder  Father Lynwood Glen     Hypertension Brother Debby Glen     Hypertension Brother Ozell Glen

## 2024-01-30 NOTE — Patient Instructions (Signed)
 01/30/2024     8:03 AM 12/19/2023     9:20 AM 11/17/2023     9:15 AM 10/30/2023     4:34 PM 10/21/2023     9:11 AM 05/16/2023     8:32 AM   Date Weight Recorded   Metric 75.297 kg 78.472 kg 79.742 kg 77.111 kg 77.111 kg 76.658 kg   Pounds/Ounces 166 lb 173 lb 175 lb 12.8 oz 170 lb 170 lb 169 lb

## 2024-01-30 NOTE — Assessment & Plan Note (Signed)
 Stable  Lipid regimen: none   Complete labs as ordered and follow up pending results   Advised patient to consume a low sugar / low simple carbohydrate diet and the need to increase plant based fiber (25 grams/day min), vegetables and whole grains to help lower cholesterol  Recommend increasing exercise and monitoring diet with goal of losing weight  Continue to monitor     Lab Results   Component Value Date    CHOL 222 (H) 09/30/2022    HDL 71 09/30/2022    LDL 133 (H) 09/30/2022    TRIG 85 09/30/2022

## 2024-01-30 NOTE — Assessment & Plan Note (Signed)
 Uncontrolled  Patient aware that even though she meets FDA criteria for semaglutide for weight management and treatment of obesity, unfortunately their insurance does not cover this indication   Counseled patient on risks and benefits of weight loss clinics such as [facility], [facility], and [facility]  The patient will pay out of pocket for the Zeppbund via [employer]   Patient counseled on side effects, risks, benefits, and mechanism of action   Eat small, frequent meals, no carbs after 6 pm unless just working out, protein with every meal, good sources - 100% whey protein powder, egg whites, beef jerky, cottage cheese, tuna, avoid white breads and sugars, eat complex carbs, fresh fruits and veggies  Discussed the need for increase in Exercise and improvement of diet  Food Journal advised  Recommend Fast, Feast, Repeat by Rosanne Drones  Recommend Obesity Code by Selinda Fall, MD  Recommend How to Lose Weight for the Last Time: Brain-Based Solutions for Permanent Weight Loss by Woodie Amber, MD  Continue to monitor

## 2024-02-02 ENCOUNTER — Other Ambulatory Visit (INDEPENDENT_AMBULATORY_CARE_PROVIDER_SITE_OTHER): Payer: Self-pay | Admitting: Family Medicine

## 2024-02-02 DIAGNOSIS — E669 Obesity, unspecified: Secondary | ICD-10-CM

## 2024-02-06 MED ORDER — ZEPBOUND 5 MG/0.5ML SC SOLN: 5.0000 mg | SUBCUTANEOUS | 11 refills | Status: AC
